# Patient Record
Sex: Male | Born: 1945 | Race: White | Hispanic: No | Marital: Married | Smoking: Never smoker
Health system: Southern US, Community
[De-identification: ages and names within clinical notes are randomized; demographics above are authoritative.]

## PROBLEM LIST (undated history)

## (undated) DIAGNOSIS — G4730 Sleep apnea, unspecified: Secondary | ICD-10-CM

## (undated) DIAGNOSIS — T8859XA Other complications of anesthesia, initial encounter: Secondary | ICD-10-CM

## (undated) DIAGNOSIS — K219 Gastro-esophageal reflux disease without esophagitis: Secondary | ICD-10-CM

## (undated) DIAGNOSIS — C801 Malignant (primary) neoplasm, unspecified: Secondary | ICD-10-CM

## (undated) DIAGNOSIS — M1990 Unspecified osteoarthritis, unspecified site: Secondary | ICD-10-CM

## (undated) DIAGNOSIS — F32A Depression, unspecified: Secondary | ICD-10-CM

## (undated) DIAGNOSIS — I10 Essential (primary) hypertension: Secondary | ICD-10-CM

## (undated) DIAGNOSIS — Z87442 Personal history of urinary calculi: Secondary | ICD-10-CM

## (undated) DIAGNOSIS — I639 Cerebral infarction, unspecified: Secondary | ICD-10-CM

---

## 1998-04-10 ENCOUNTER — Encounter (HOSPITAL_COMMUNITY): Admission: RE | Admit: 1998-04-10 | Discharge: 1998-07-09 | Payer: Self-pay | Admitting: Neurology

## 2006-07-28 ENCOUNTER — Encounter: Admission: RE | Admit: 2006-07-28 | Discharge: 2006-07-28 | Payer: Self-pay | Admitting: Neurological Surgery

## 2006-08-27 ENCOUNTER — Observation Stay (HOSPITAL_COMMUNITY): Admission: RE | Admit: 2006-08-27 | Discharge: 2006-08-28 | Payer: Self-pay | Admitting: Orthopedic Surgery

## 2006-09-24 ENCOUNTER — Encounter: Admission: RE | Admit: 2006-09-24 | Discharge: 2006-09-24 | Payer: Self-pay | Admitting: Neurological Surgery

## 2006-10-14 ENCOUNTER — Encounter: Payer: Self-pay | Admitting: Urology

## 2006-10-15 ENCOUNTER — Ambulatory Visit (HOSPITAL_COMMUNITY): Admission: RE | Admit: 2006-10-15 | Discharge: 2006-10-16 | Payer: Self-pay | Admitting: Urology

## 2007-01-11 ENCOUNTER — Encounter: Admission: RE | Admit: 2007-01-11 | Discharge: 2007-01-11 | Payer: Self-pay | Admitting: Neurological Surgery

## 2007-04-12 ENCOUNTER — Encounter: Admission: RE | Admit: 2007-04-12 | Discharge: 2007-04-12 | Payer: Self-pay | Admitting: Neurological Surgery

## 2007-06-04 ENCOUNTER — Ambulatory Visit (HOSPITAL_COMMUNITY): Admission: RE | Admit: 2007-06-04 | Discharge: 2007-06-04 | Payer: Self-pay | Admitting: Surgery

## 2007-07-19 ENCOUNTER — Encounter: Admission: RE | Admit: 2007-07-19 | Discharge: 2007-07-19 | Payer: Self-pay | Admitting: Neurological Surgery

## 2007-08-30 IMAGING — CR DG CERVICAL SPINE 1V
1 series · 1 of 1 positions shown · non-contrast
Comparison: Intraoperative spot film dated 08/27/06.

CLINICAL DATA: Post cervical fusion.  Posterior neck pain radiating to right shoulder and arm.  
CERVICAL SPINE ONE VIEW:

[view not recorded]
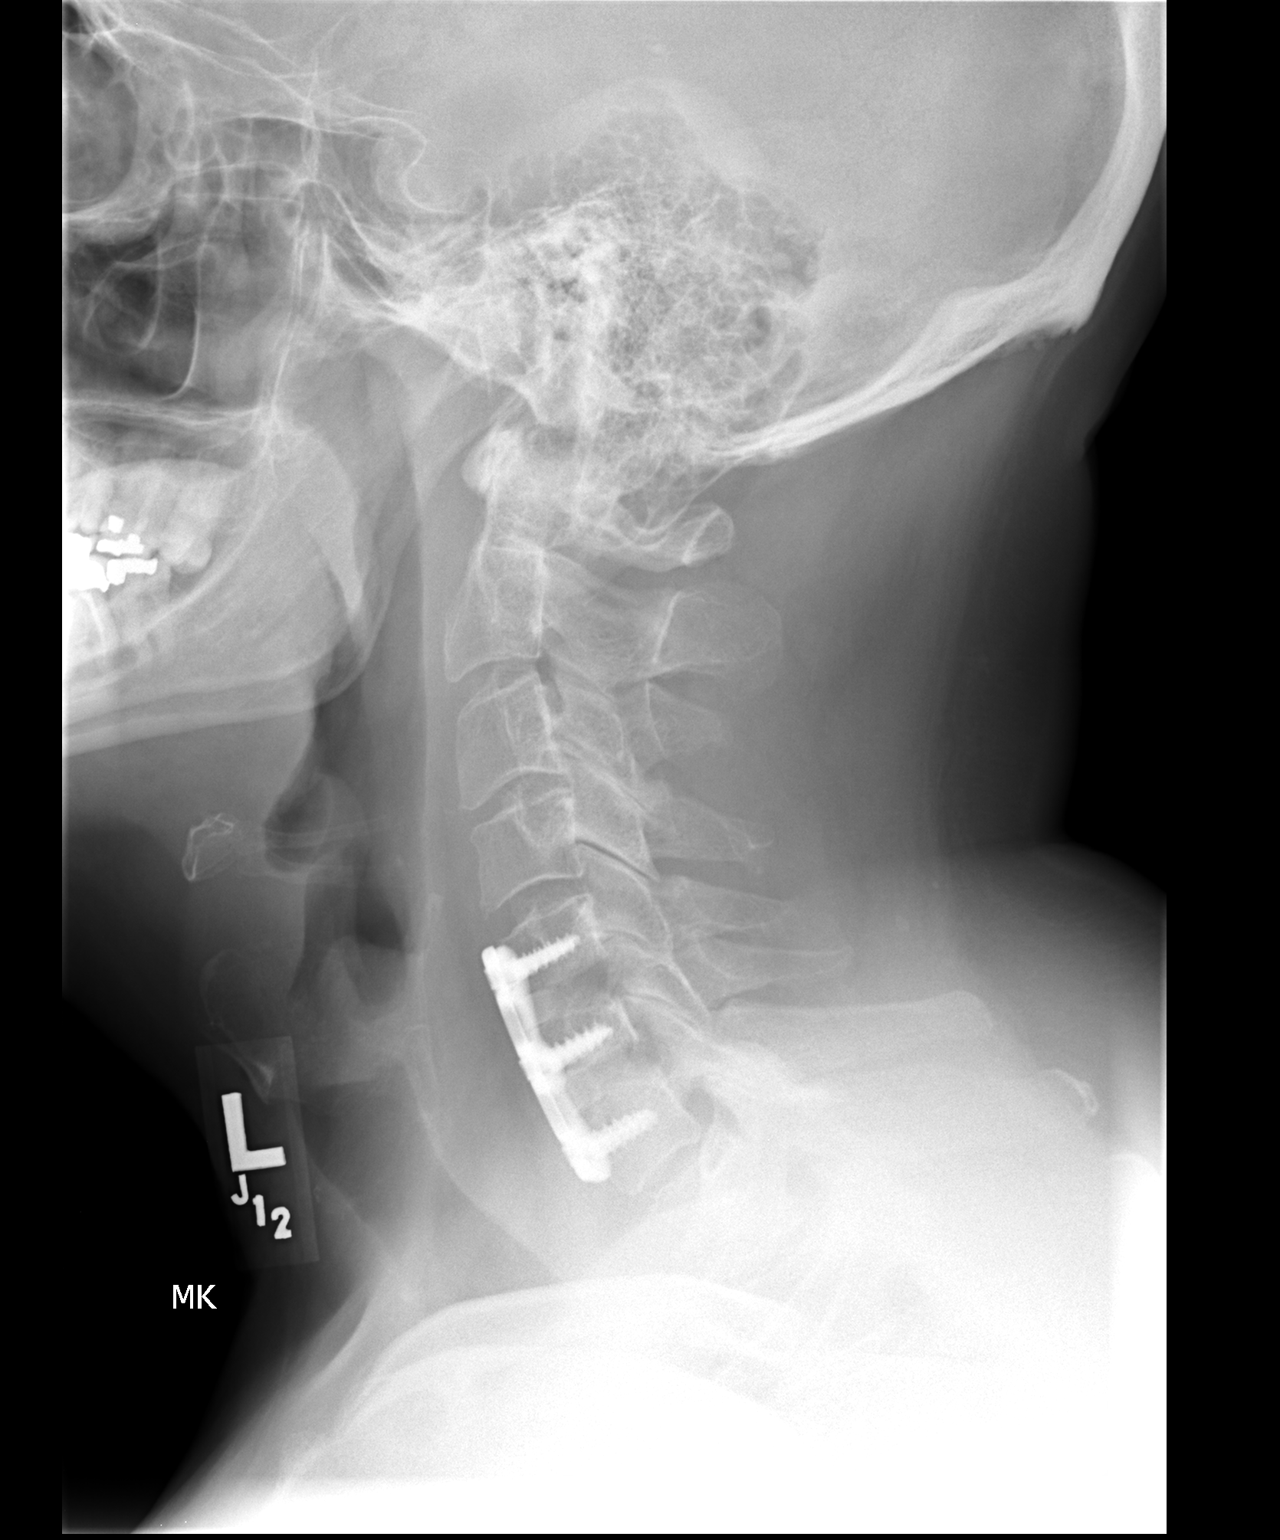

[1 of 1 positions shown; findings below may reference images not displayed]

FINDINGS: Status post C5 to C7 anterior discectomy with fusion and plating.  Good position and alignment of the bony elements and hardware.  There is perhaps mild fullness of the prevertebral soft tissues anterior to the plate.  This may be a normal finding three weeks post operatively, but a hematoma or abcess cannot be excluded.  needs clinical correlation.  The appearance has changed since the intraoperative film.
IMPRESSION: 1.  Satisfactory position and alignment post C5 to C7 ACDF with anterior plate. 
2.  There is fullness of the prevertebral soft tissues.  The significance is questionable in the post operative.  Careful clinical correlation needed.

## 2008-05-15 ENCOUNTER — Encounter: Admission: RE | Admit: 2008-05-15 | Discharge: 2008-05-15 | Payer: Self-pay | Admitting: Neurological Surgery

## 2008-05-19 ENCOUNTER — Encounter: Admission: RE | Admit: 2008-05-19 | Discharge: 2008-05-19 | Payer: Self-pay | Admitting: Neurological Surgery

## 2008-06-14 ENCOUNTER — Ambulatory Visit: Admission: RE | Admit: 2008-06-14 | Discharge: 2008-06-14 | Payer: Self-pay | Admitting: Neurological Surgery

## 2008-07-04 ENCOUNTER — Inpatient Hospital Stay (HOSPITAL_COMMUNITY): Admission: RE | Admit: 2008-07-04 | Discharge: 2008-07-06 | Payer: Self-pay | Admitting: Neurological Surgery

## 2008-08-07 ENCOUNTER — Encounter: Admission: RE | Admit: 2008-08-07 | Discharge: 2008-08-07 | Payer: Self-pay | Admitting: Neurological Surgery

## 2008-10-16 ENCOUNTER — Encounter: Admission: RE | Admit: 2008-10-16 | Discharge: 2008-10-16 | Payer: Self-pay | Admitting: Neurological Surgery

## 2009-01-17 ENCOUNTER — Encounter: Admission: RE | Admit: 2009-01-17 | Discharge: 2009-01-17 | Payer: Self-pay | Admitting: Neurological Surgery

## 2009-05-20 IMAGING — CR DG CHEST 2V
2 series · 2 of 2 positions shown · non-contrast
Comparison: 08/25/2006.

CLINICAL DATA: Preadmit.

CHEST - 2 VIEW

[w chest pa]
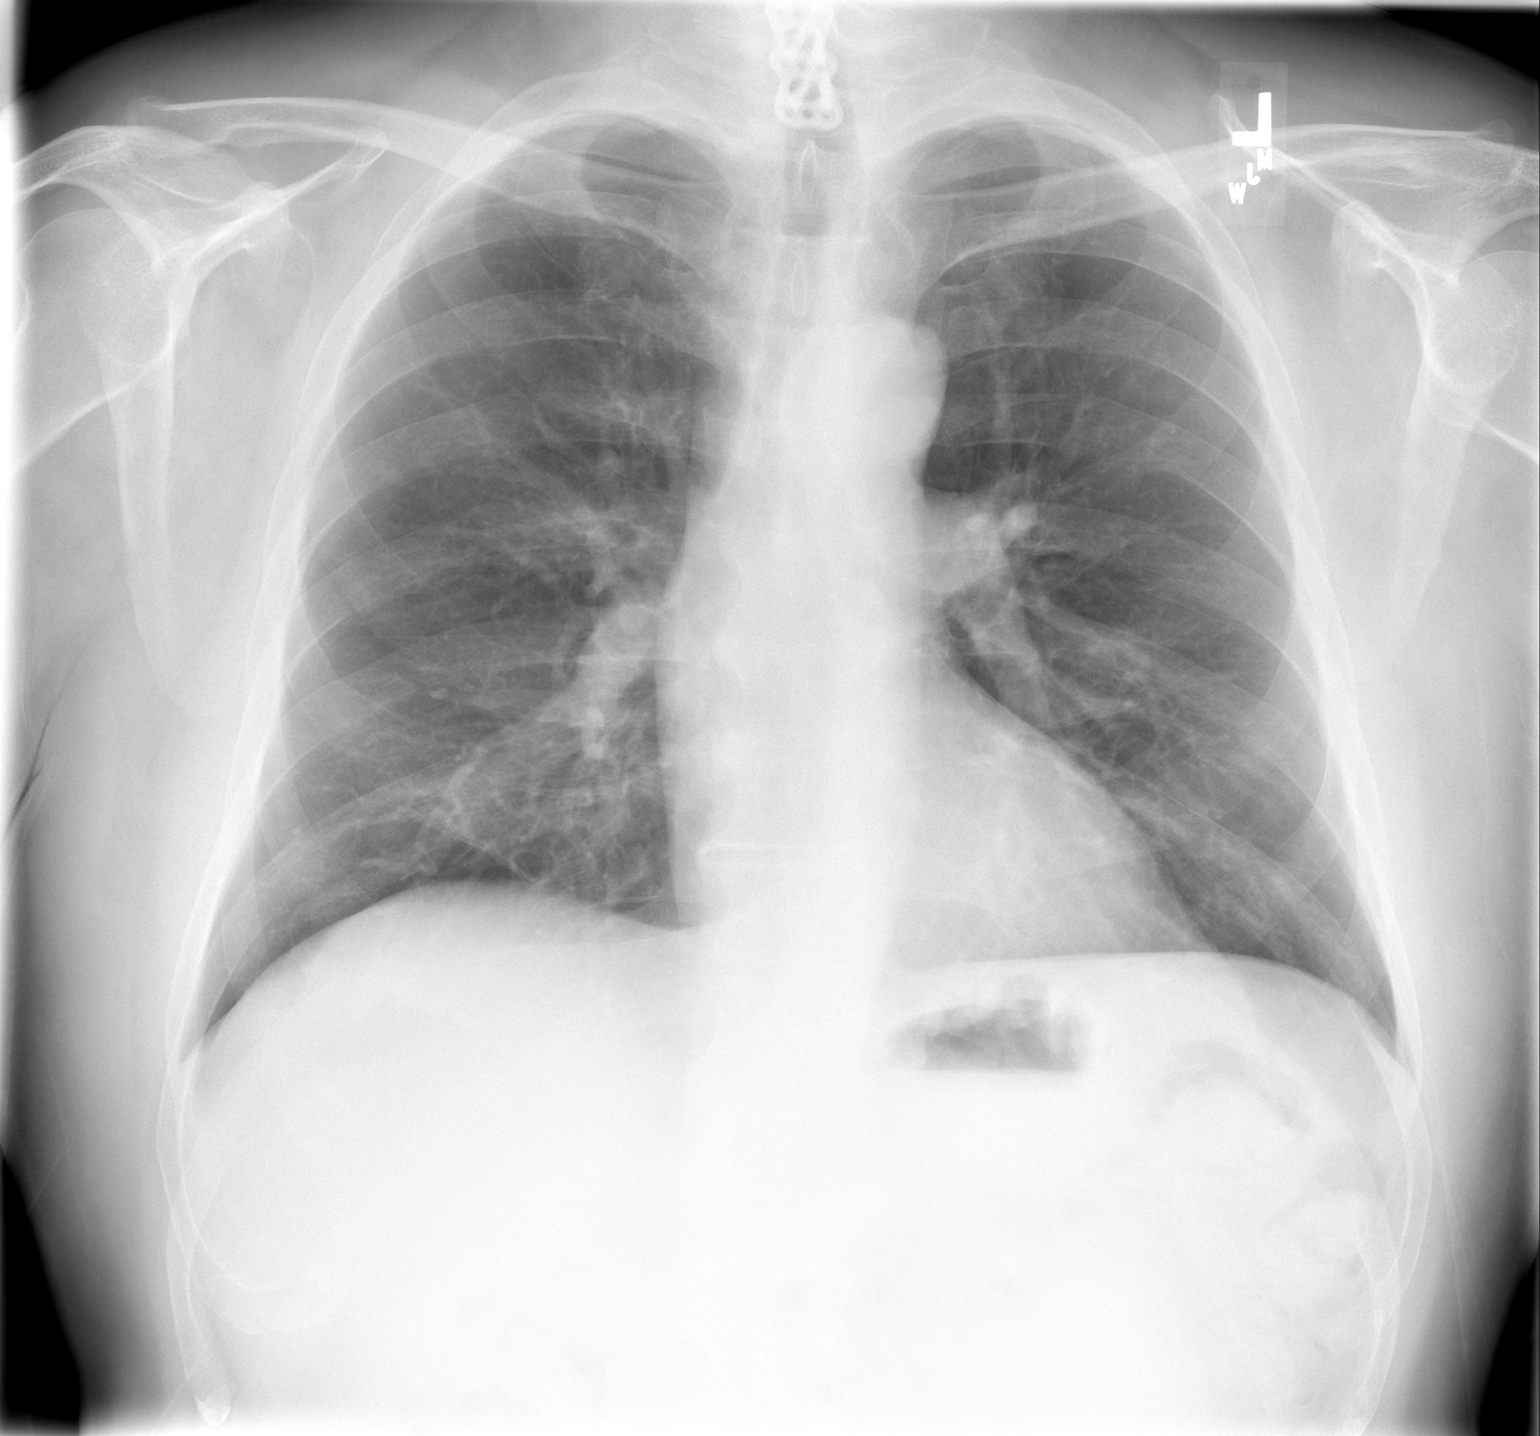

[w chest lat]
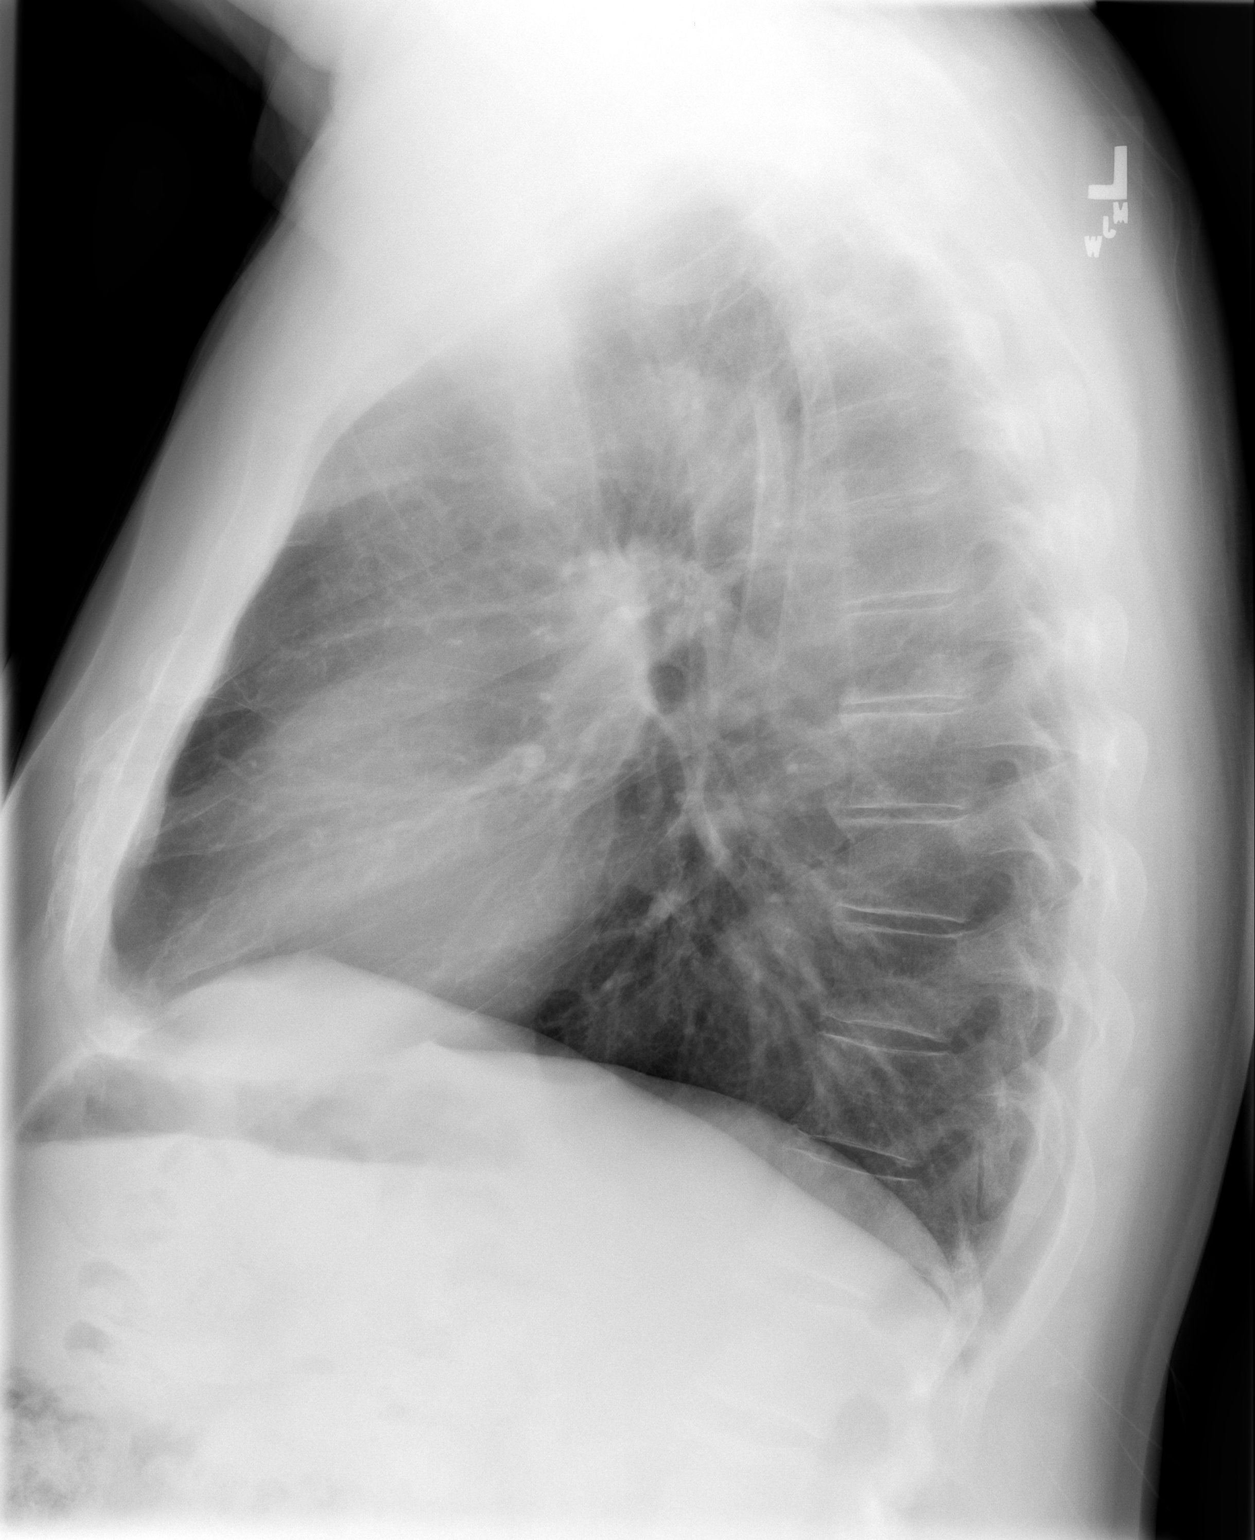

[2 of 2 positions shown; findings below may reference images not displayed]

FINDINGS: Heart size is normal and the vascularity is normal.  The
lungs are clear and  there is no infiltrate or effusion.  There is
no mass lesion.
IMPRESSION: No active cardiopulmonary disease and no interval change.

## 2010-08-08 LAB — DIFFERENTIAL
Eosinophils Relative: 6 % — ABNORMAL HIGH (ref 0–5)
Lymphocytes Relative: 32 % (ref 12–46)

## 2010-08-08 LAB — TYPE AND SCREEN

## 2010-08-08 LAB — BASIC METABOLIC PANEL
Calcium: 9.7 mg/dL (ref 8.4–10.5)
GFR calc non Af Amer: >60 mL/min (ref >60)
Glucose, Bld: 134 mg/dL — ABNORMAL HIGH (ref 70–99)
Potassium: 4.5 mEq/L (ref 3.5–5.1)
Sodium: 137 mEq/L (ref 135–145)

## 2010-08-08 LAB — CBC
HCT: 46.7 % (ref 39.0–52.0)
Hemoglobin: 16.3 g/dL (ref 13.0–17.0)
MCHC: 34.8 g/dL (ref 30.0–36.0)
MCV: 93.8 fL (ref 78.0–100.0)
Platelets: 226 10*3/uL (ref 150–400)
RDW: 13 % (ref 11.5–15.5)

## 2010-08-13 LAB — DIFFERENTIAL
Eosinophils Absolute: 0.3 10*3/uL (ref 0.0–0.7)
Eosinophils Relative: 5 % (ref 0–5)
Lymphs Abs: 1.7 10*3/uL (ref 0.7–4.0)
Monocytes Absolute: 0.5 10*3/uL (ref 0.1–1.0)
Monocytes Relative: 9 % (ref 3–12)

## 2010-08-13 LAB — TYPE AND SCREEN: Antibody Screen: NEGATIVE

## 2010-08-13 LAB — BASIC METABOLIC PANEL
CO2: 29 mEq/L (ref 19–32)
Chloride: 103 mEq/L (ref 96–112)
GFR calc Af Amer: >60 mL/min (ref >60)
Potassium: 4.9 mEq/L (ref 3.5–5.1)

## 2010-08-13 LAB — CBC
HCT: 45.6 % (ref 39.0–52.0)
MCV: 94 fL (ref 78.0–100.0)
RBC: 4.85 MIL/uL (ref 4.22–5.81)
WBC: 5.6 10*3/uL (ref 4.0–10.5)

## 2010-08-13 LAB — ABO/RH: ABO/RH(D): A POS

## 2010-09-10 NOTE — Op Note (Signed)
NAMEKALUM, MINNER              ACCOUNT NO.:  [id number]   MEDICAL RECORD NO.:  [id number]          PATIENT TYPE:  OIB   LOCATION:  1422                         FACILITY:  [facility]   PHYSICIAN:  Excell Seltzer. Annabell Howells, M.D.    DATE OF BIRTH:  May 25, 1945   DATE OF PROCEDURE:  DATE OF DISCHARGE:                               OPERATIVE REPORT   PREOPERATIVE DIAGNOSIS:  Benign prostatic hypertrophy and bladder outlet  obstruction.   POSTOPERATIVE DIAGNOSIS:  Benign prostatic hypertrophy and bladder  outlet obstruction.   SURGEON:  Dr. Bjorn Pippin.   ANESTHESIA:  General.   SPECIMEN:  Prostate chips.   BLOOD LOSS:  Approximately 200 mL.   DRAINS:  A 22 French 3-way Foley catheter.   COMPLICATIONS:  None.   INDICATIONS:  Mr. Menna is a 65 year old white male with a history of  BPH and bladder outlet obstruction who is to undergo TURP.   FINDINGS AND PROCEDURE:  He was given Cipro.  He was taken to the  operating room where a general anesthetic was induced.  PAS hose were  placed.  He was placed in the lithotomy position.  His perineum and  genitalia were prepped with Betadine solution, he was draped in the  usual sterile fashion.  A cystoscopy was performed using a 22 [place]  scope and 12 and 70 degree lens.  Examination revealed a normal urethra.  The external sphincter was intact.  The prostatic urethra had trilobar  hyperplasia with obstruction.  Examination of bladder revealed mild  trabeculation.  No tumors, stones or inflammation were noted.  Ureteral  orifices were well away from the bladder neck.   The urethra was then calibrated to 32 [place] with [employer] and a  28 French continuous flow resectoscope was used, it was inserted without  difficulty.  This was fitted with an Wandra Scot handle with a 12 degree  lens and prostate loop.   The mid lobe of the prostate was resected initially.  Resection was then  carried out along the floor of the prostate up to and alongside  of the  verumontanum.  The left lobe of the prostate was resected from bladder  neck to apex out to the capsular fibers.  The right lobe was resected  from bladder neck to apex out to the capsular fibers.  The bladder was  evacuated free of chips and residual apical and anterior tissue was then  resected and those chips were removed.  Inspection revealed an excellent  TUR channel, no retained chips in the bladder.  There was some minor  venous bleeding at the right bladder neck but no arterial bleeding.  Upon removal of the resectoscope pressure on the bladder produced an  excellent strain.  A #22 [place] 3-way Foley catheter was inserted,  balloon was filled with 30 mL of sterile fluid.  The catheter was held  on traction and hand irrigated until clear.  The traction was then  released  and irrigant remained clear.  The patient's catheter was placed to  straight drainage and secured to thigh on light traction with a Velcro  strap.  He was taken down from lithotomy position.  His anesthetic was  reversed.  He was removed to the recovery room in stable condition,  having no complications.      Excell Seltzer. Annabell Howells, M.D.  Electronically Signed     JJW/MEDQ  D:  10/15/2006  T:  10/15/2006  Job:  [id number]   cc:   Alexia Freestone, M.D.  Fax: [phone]

## 2010-09-10 NOTE — Discharge Summary (Signed)
[name], [name]:  [id number]   MEDICAL RECORD NO.:  [id number]          PATIENT TYPE:  INP   LOCATION:  3041                         FACILITY:  [facility]   PHYSICIAN:  Tia Alert, MD     DATE OF BIRTH:  [birth year]   DATE OF ADMISSION:  07/04/2008  DATE OF DISCHARGE:  07/06/2008                               DISCHARGE SUMMARY   PREOPERATIVE DIAGNOSES:  1. Lumbar spinal stenosis, L3-L4.  2. Spondylolisthesis, L5-S1 with segmental instability.   PROCEDURES:  1. Depressive lumbar laminectomy, L3-L4.  2. Posterior lumbar interbody fusion, L5-S1.   BRIEF HISTORY OF PRESENT ILLNESS:  [name] is a 65 year old gentleman  who has a long history of progressive low back pain with bilateral leg  pain.  He had known spinal stenosis at L3-L4 with a grade 1  anterolisthesis of L5-S1 with segmental instability at that level with  degenerative disease.  He tried medical management for quite some time  without significant relief.  I recommended decompressive laminectomy at  L3-L4 and instrumented fusion at L5-S1.  He understood the risks,  benefits, and expected outcome and wished to proceed.   HOSPITAL COURSE:  The patient was admitted on July 04, 2008 and was  taken to the operating room where he underwent a lumbar laminectomy at  L3-L4 and instrumented fusion at L5-S1.  The patient tolerated the  procedure and was taken to the recovery room and then to the floor in  stable condition.  For details of the operative procedure, please see  the dictated operative note.  The patient's hospital course was routine.  There were no complications.  He remained afebrile with stable vital  signs.  He had a Hemovac in place until discharge.  He had control of  his pain with a Dilaudid PCA for the first 2 days and then was switched  to oral pain medications which he tolerated well and this controlled his  pain.  He had minimal pain on the day of discharge.  He had no leg  pain  and no numbness, tingling, or weakness.  He is ambulating well, voiding  well, and eating well.  He was discharged home in stable condition on  July 06, 2008 with plans to follow up in 2 weeks.   FINAL DIAGNOSIS:  Posterior lumbar interbody fusion, L5-S1.      Tia Alert, MD  Electronically Signed     DSJ/MEDQ  D:  07/06/2008  T:  07/07/2008  Job:  [id number]

## 2010-09-10 NOTE — Op Note (Signed)
Ivan Washington, Ivan Washington NO.:  [id number]   MEDICAL RECORD NO.:  [id number]          PATIENT TYPE:  INP   LOCATION:  3041                         FACILITY:  [facility]   PHYSICIAN:  Tia Alert, MD     DATE OF BIRTH:  1945-10-14   DATE OF PROCEDURE:  07/04/2008  DATE OF DISCHARGE:                               OPERATIVE REPORT   PREOPERATIVE DIAGNOSES:  1. Lumbar spinal stenosis, L3-4.  2. Degenerative disk disease with grade 1 anterior listhesis, L5-S1.  3. Back pain.  4. Leg pain.   POSTOPERATIVE DIAGNOSES:  1. Lumbar spinal stenosis, L3-4.  2. Degenerative disk disease with grade 1 anterior listhesis, L5-S1.  3. Back pain.  4. Leg pain.   PROCEDURE:  1. Decompressive lumbar laminectomy, hemifacetectomy, and      foraminotomies at L5-S1 for decompression of L5 and S1 nerve roots      requiring more work than is required with the typical posterior      lumbar interbody fusion procedure.  2. Lumbar hemilaminectomy, medial facetectomy, and foraminotomy at L3-      4 on the left followed by sublaminar decompression L3-4 for central      canal and right lateral recess decompression for decompression of      the L4 nerve roots bilaterally.  Note, this is a separate level      from the interbody fusion level.  3. Posterior lumbar interbody fusion L5-S1 utilizing an 8 x 22-mm PEEK      interbody cage packed with local autograft and Actifuse putty and      an 8 x 22-mm tangent interbody bone wedge.  4. Intertransverse arthrodesis L5-S1 utilizing locally harvested      morselized autologous bone graft and Actifuse putty.  5. Nonsegmental fixation L5-S1 utilizing the [employer] pedicle screw      system.   SURGEON:  Tia Alert, MD   ASSISTANT:  Kathaleen Maser. Pool, MD   ANESTHESIA:  General endotracheal.   COMPLICATIONS:  None apparent.   INDICATIONS FOR PROCEDURE:  Mr. Struckman is a 65 year old gentleman who  has had a long history of progressive mechanical back pain  along with  bilateral leg pain down the fronts and backs of his left leg worse than  his right leg.  He had an MRI an year ago which showed degenerative disk  disease with a grade 1 anterior listhesis of L5 on S1 with mild spinal  stenosis at L3-4.  He then underwent CT myelography which showed  progression of the stenosis at L3-4 with the segmental instability at L5-  S1 and foraminal stenosis at that level.  Recommended decompressive  laminectomy at L3-4 to address the spinal stenosis followed by an  instrumented fusion at L5-S1.  This to address the foraminal stenosis  and the segmental instability at that level.  He understood the risks,  benefits, and expected outcome and wished to proceed.   DESCRIPTION OF PROCEDURE:  The patient was taken to operating room and  after induction of adequate generalized endotracheal anesthesia, he was  rolled in the prone position on chest  rolls and all pressure points were  padded.  His lumbar region was prepped with DuraPrep and then draped in  the usual sterile fashion.  A 10 mL of local anesthesia was injected,  and a dorsal midline incision was made and carried down to the  lumbosacral fascia.  The fascia was opened and the paraspinous  musculature was taken down in a subperiosteal fashion to expose L3-4, L4-  5, and L5-S1.  Intraoperative fluoroscopy confirmed my levels and then I  started at L3-4 and performed a hemilaminectomy, medial facetectomy, and  foraminotomy at L3-4 on the left utilizing the Kerrison punch.  The  underlying yellow ligament was opened and removed to expose the  underlying dura and L4 nerve root.  I dissected out to the medial  pedicle wall and into the foramen to decompress the L4 nerve root  distally and undercut lateral recess.  I then drilled up under the  spinous process and then used a Kerrison punch to bite across the  midline into the right lateral recess from the left-sided approach until  I had the right L4  nerve root decompressed as well as the lateral  recess.  Once this was done, I packed this with Gelfoam and then went to  L5-S1.  I removed the spinous process and then performed a complete  laminectomy, hemifacetectomy, and foraminotomy at L5-S1.  The yellow  ligament was removed.  The L5 and S1 nerve roots were identified and  decompressed distally into their respective foramina, then palpated  along those nerves to assure adequate decompression.  I then decided to  put the pedicle screws.  I first localized the pedicle entry zones  utilizing surface landmarks and lateral fluoroscopy.  I probed each  pedicle with a pedicle probe, then tapped each pedicle with a 5.5 tap,  and then placed 6.5 x 45-mm pedicle screws into the L5 pedicles  bilaterally, and then placed 6.5 x 40-mm pedicle screws into the S1  pedicles bilaterally.  I then decorticated the transverse processes and  placed a mixture of local autograft and Actifuse out over these to  perform intertransverse arthrodesis at L5-S1.  I then incised the disk  space bilaterally and used sequential distraction to distract the disk  space to 8 mm.  It was a very collapsed disk space.  We then performed a  thorough intradiskal diskectomy utilizing rotating cutters and cutting  chisels and midline was prepared with Epstein curettes.  A near total  diskectomy was performed.  The cutting chisel used to prepare the  endplates for arthrodesis.  We then placed an 8 x 22-mm tangent  interbody bone wedge into the interspace at L5-S1 on the left and an 8 x  22-mm PEEK interbody cage packed with local autograft and Actifuse putty  on the patient's right side.  The midline was packed local autograft and  Actifuse putty.  We then irrigated with saline solution containing  bacitracin, placed lordotic rods into the multiaxial screw heads of the  pedicle screws and locked these into position with locking caps and anti-  torque device while achieving  compression of our grafts.  I then placed  a medium Hemovac drain through a separate stab incision, lined the dura  with Gelfoam, and then closed the muscle and the fascia with 0 Vicryl,  closed the subcutaneous and subcuticular tissue with 2-0 and 3-0 Vicryl,  and closed the skin with Benzoin and Steri-Strips.  The drapes were  removed.  A sterile dressing  was applied.  The patient was awakened from  general anesthesia and transported to the recovery room in stable  condition.  At the end of the procedure, all sponge, needles, and  instrument counts were correct.      Tia Alert, MD  Electronically Signed     DSJ/MEDQ  D:  07/04/2008  T:  07/04/2008  Job:  [id number]

## 2010-09-10 NOTE — Op Note (Signed)
Ivan Washington, Ivan Washington              ACCOUNT NO.:  [id number]   MEDICAL RECORD NO.:  [id number]          PATIENT TYPE:  AMB   LOCATION:  DAY                          FACILITY:  [facility]   PHYSICIAN:  Thomas A. Cornett, M.D.DATE OF BIRTH:  [birth year]   DATE OF PROCEDURE:  06/04/2007  DATE OF DISCHARGE:                               OPERATIVE REPORT   PREOPERATIVE DIAGNOSIS:  Recurrent right inguinal hernia.   POSTOPERATIVE DIAGNOSIS:  Recurrent right inguinal hernia.   PROCEDURE:  Laparoscopic extraperitoneal right inguinal hernia repair  with Ultrapro mesh.   SURGEON:  Maisie Fus A. Cornett, M.D.   ANESTHESIA:  General endotracheal anesthesia 0.25% Sensorcaine.   ESTIMATED BLOOD LOSS:  20 mL.   SPECIMENS:  None.   INDICATIONS FOR PROCEDURE:  The patient is a 65 year old male who had a  right inguinal hernia repair about 30 years ago.  He developed  recurrence in his right groin.  We discussed options which include open  versus laparoscopic and he wished to proceed with laparoscopic approach.  Risks of the procedure were discussed preoperatively with the patient.  He voiced his understanding and agreed to proceed.   DESCRIPTION OF PROCEDURE:  The patient was brought to the operating  room, placed supine.  After induction of general anesthesia, a Foley  catheter was placed.  After sterile prep and drape of the abdomen, a 1  cm infraumbilical incision was made.  Dissection was carried down to his  fascia and his fascia was opened with a scalpel.  Muscle fibers of the  right rectus were retracted laterally.  The posterior sheath was  identified.  I used my finger to bluntly create a space below the  muscle.  We then placed a Spacemaker balloon port combination advanced  down until I hit the pubis.  We then insufflated the balloon in the  preperitoneal space under direct visualization with the scope.  We kept  it inflated for about a minute to expand completely.  Once this was  done, the  balloon was removed the preperitoneal space was insufflated.  Other significant preperitoneal fat.  We focused our attention to the  right inguinal canal.  In the midline, two 5 mm ports were placed with  local anesthesia under direct vision.  We then used graspers, we began  to identify what appeared to be the hernia.  There is a direct hernia  and I reduced some preperitoneal fat out of this.  The cord structures  were laterally and were scarred down from his previous repair it looked  like.  We were able to identify the pubis as well.  I then created a  small space anterior to the bladder below the pubis.  Once all these  landmarks were identified, I examined the left inguinal canal.  I did  not see any evidence of hernia laparoscopically on this side.  I then  used a large piece of Ultrapro mesh measuring 3 inches x 6 inches.  We  placed this in the preperitoneal space.  I tucked this down behind the  pubic symphysis.  Then I pulled it up until  I was well above the defect  on the anterior abdominal wall.  This was then laid out laterally.  I  then used Tisseal and applied the mesh until I had it where I wanted it.  The Tisseal set up for about 10 minutes.  The mesh appeared adequately  fixed to below the iliopubic tract on top of the iliac vessels behind  the pubis and then superiorly, it was well above the defect and the  undersurface of the rectus abdominis muscle.  Laterally, we were well  above the anterior superior iliac spine.  I then fixed for hemostasis  and found it to be excellent.  We then slowly de-insufflated the  prepared space under direct vision and the mesh seemed to stay in place  quite nicely.  I then removed all trocars.  I closed the anterior sheath  of the rectus abdominis muscle with a figure-of-eight of 0 Vicryl.  Skin  incisions were closed 4-0 Monocryl.  Dermabond was applied.  Foley  catheter was then removed.  All final counts of sponges, needles and   instruments were found to be correct at this portion of the case.  The  patient was then extubated and taken to recovery in satisfactory  condition.      Thomas A. Cornett, M.D.  Electronically Signed     TAC/MEDQ  D:  06/04/2007  T:  06/06/2007  Job:  [id number]   cc:   Excell Seltzer. Annabell Howells, M.D.  Fax: [phone]

## 2010-09-13 NOTE — Op Note (Signed)
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   PROCEDURES:  1. Decompressive anterior cervical diskectomy C5-6, C6-7.  2. Anterior cervical arthrodesis C5-6, C6-7 utilizing a 6-mm MTF      corticocancellous allograft at C5-6 and a 7 mm graft at C6-7.  3. Anterior cervical plating C5-C7 utilizing a 42 mm Ventura plate.   SURGEON:  Dr. Marikay Alar.   ASSISTANT:  Dr. Aliene Beams.   ANESTHESIA:  General endotracheal.   COMPLICATIONS:  None apparent.   INDICATIONS FOR PROCEDURE:  Ivan Washington is a 65 year old gentleman who  was referred with neck and right arm pain.  He had an MRI and then a CT  myelogram which showed spondylosis at C5-6 and C6-7 with significant  degenerative disk disease and collapse of the disk space with  biforaminal narrowing and cut off of the right C6 nerve root sleeve.  He  had tried medical management for quite some time without significant  relief.  I recommended a decompressive anterior cervical diskectomy with  fusion plating at C5-6, C6-7.  He understood the risks, benefits, and  expected outcome and wished to proceed.   DESCRIPTION OF PROCEDURE:  The patient was taken to the operating room  after induction of adequate generalized endotracheal anesthesia he was  placed in the supine position on the operating room table.  His right  anterior cervical region was prepped with DuraPrep and then draped in a  usual sterile fashion.  Five mL of local anesthesia was injected and a  transverse incision was made to the right of the midline and carried  down to the platysma which was elevated, opened and undermined with  Metzenbaum scissors.  I then dissected a plane medial to the  sternocleidomastoid muscle and internal carotid artery and lateral to  the trachea and esophagus to expose C5-6 and C6-7.  Intraoperative  fluoroscopy confirmed my level and then the longus colli muscles were  taken down and the Shadow line retractors were placed under this to  expose C5-6 and C6-7.  The annulus was incised and then a neutral  diskectomy was done with pituitary rongeurs and Karlin curettes and then  the high-speed drill was used to drill the endplates to prepare for  later arthrodesis.  We drilled the C5-6 disk to a height of 6 mm and the  C6-7 disk to a height of 7 mm.  We drilled down to the level of the  __________ ligament drilling away posterior osteophytes as we went.  We  then brought in the operating microscope.  We started at C5-6.  We  opened the posterior longitudinal ligament with a nerve hook and then  removed this by undercutting the bodies of C5-C6 with the 2-mm Kerrison  punch.  Bilateral foraminotomies were performed.  He had an interesting  takeoff at the C6 nerve root.  It was actually inferior to the disk  space and the pedicle and then the nerve root swept superiorly and  around the pedicle and then down again.  These nerve roots were  visualized, followed out into the foramen and decompressed while  marching along the pedicle.  Bilateral foraminotomies were performed and  then we used a black nerve hook to palpate into the midline and into the  foramina to assure adequate decompression of both levels.  We then lined  this level with Gelfoam and performed the exact same decompression at C6-  7 by opening the posterior longitudinal ligament and then undercutting  the bodies of C6-C7 to decompress the central canal and then performed  bilateral  foraminotomies by identifying the pedicle, identifying the  nerve root and then marching out into the foramen along the nerve root  and decompress the nerve roots.  We then palpated with a black nerve  hook once again and into the midline and into the foramen to assure  adequate decompression.  Again the dura was nice and capacious and full  at both levels.  We could see the cord pulsatile through the dura.   We then irrigated with saline solution, dried all bleeding points.  We  lined the dura with Gelfoam and placed a 6 to dry the surgical bed.  We  irrigated once again, inspected our endplates to assure that we had nice  surfaces for fusion.  We then used a 6-mm MTF corticocancellous  allograft at C5-6 and a 7 mm graft and C6-7 and tapped these into  position.  We then used a 42-mm Venture plate and placed  two 13-mm very  variable angle screws in the bodies of C5, C6 and C7 and these locked  into the plate by the locking mechanism within the plate.  We then  irrigated with saline solution containing bacitracin, dried all bleeding  points with bipolar cautery.  Once meticulous hemostasis was achieved  closed the platysma with 3-0 Vicryl, closed the subcuticular tissue with  3-0 Vicryl and closed the skin with Benzoin and Steri-Strips.  The  drapes were removed.  A sterile dressing was applied.  The patient was  awakened from general anesthesia and transferred to the recovery room in  stable condition.  At the end of the procedure all sponge, needle and  instrument counts were correct.      Tia Alert, MD  Electronically Signed     DSJ/MEDQ  D:  08/27/2006  T:  08/27/2006  Job:  [id number]

## 2011-01-17 LAB — DIFFERENTIAL
Basophils Absolute: 0
Lymphocytes Relative: 28
Monocytes Absolute: 0.5
Neutro Abs: 3.6

## 2011-01-17 LAB — BASIC METABOLIC PANEL
Calcium: 9.8
GFR calc Af Amer: >60
GFR calc non Af Amer: >60
Glucose, Bld: 101 — ABNORMAL HIGH
Sodium: 138

## 2011-01-17 LAB — CBC
Hemoglobin: 16.1
RBC: 5.1
RDW: 13.7

## 2011-02-12 LAB — CBC
HCT: 44
Hemoglobin: 15.1
MCV: 91.2
RBC: 4.82
WBC: 6.9

## 2011-02-12 LAB — BASIC METABOLIC PANEL
BUN: 20
Chloride: 103
GFR calc Af Amer: >60
Potassium: 4.8

## 2012-02-19 ENCOUNTER — Other Ambulatory Visit: Payer: Self-pay | Admitting: Neurological Surgery

## 2012-02-19 DIAGNOSIS — M545 Low back pain: Secondary | ICD-10-CM

## 2012-02-24 ENCOUNTER — Ambulatory Visit
Admission: RE | Admit: 2012-02-24 | Discharge: 2012-02-24 | Disposition: A | Discharge status: Home / Self Care | Payer: Medicare Other | Source: Ambulatory Visit | Attending: Neurological Surgery | Admitting: Neurological Surgery

## 2012-02-24 DIAGNOSIS — M545 Low back pain: Secondary | ICD-10-CM

## 2012-02-24 MED ORDER — GADOBENATE DIMEGLUMINE 529 MG/ML IV SOLN
19.0000 mL | Freq: Once | INTRAVENOUS | Status: AC | PRN
  Administered 2012-02-24: 19 mL via INTRAVENOUS

## 2020-04-09 ENCOUNTER — Other Ambulatory Visit: Payer: Self-pay | Admitting: Neurological Surgery

## 2020-04-09 DIAGNOSIS — M532X9 Spinal instabilities, site unspecified: Secondary | ICD-10-CM

## 2020-04-09 DIAGNOSIS — M48061 Spinal stenosis, lumbar region without neurogenic claudication: Secondary | ICD-10-CM

## 2020-04-23 ENCOUNTER — Encounter (HOSPITAL_COMMUNITY): Payer: Self-pay | Admitting: Neurological Surgery

## 2020-04-23 ENCOUNTER — Other Ambulatory Visit (HOSPITAL_COMMUNITY)
Admission: RE | Admit: 2020-04-23 | Discharge: 2020-04-23 | Disposition: A | Discharge status: Home / Self Care | Payer: Medicare Other | Source: Ambulatory Visit | Attending: Neurological Surgery | Admitting: Neurological Surgery

## 2020-04-23 DIAGNOSIS — Z20822 Contact with and (suspected) exposure to covid-19: Secondary | ICD-10-CM | POA: Insufficient documentation

## 2020-04-23 DIAGNOSIS — Z01812 Encounter for preprocedural laboratory examination: Secondary | ICD-10-CM | POA: Insufficient documentation

## 2020-04-23 LAB — SARS CORONAVIRUS 2 (TAT 6-24 HRS): SARS Coronavirus 2: NEGATIVE

## 2020-04-23 NOTE — Progress Notes (Signed)
Patient denies chest pain or shortness of breath.  Completed health history.  Last dose of Xarelto was 04/19/20.  Covid test results pending from today, 04/23/20.Labs ordered for DOS.

## 2020-04-25 ENCOUNTER — Inpatient Hospital Stay (HOSPITAL_COMMUNITY): Payer: Medicare Other | Admitting: Certified Registered"

## 2020-04-25 ENCOUNTER — Encounter (HOSPITAL_COMMUNITY): Payer: Self-pay | Admitting: Neurological Surgery

## 2020-04-25 ENCOUNTER — Inpatient Hospital Stay (HOSPITAL_COMMUNITY): Payer: Medicare Other

## 2020-04-25 ENCOUNTER — Other Ambulatory Visit: Payer: Self-pay

## 2020-04-25 ENCOUNTER — Inpatient Hospital Stay (HOSPITAL_COMMUNITY)
Admission: RE | Admit: 2020-04-25 | Discharge: 2020-04-26 | DRG: 455 | Disposition: A | Discharge status: Home Health | Payer: Medicare Other | Attending: Neurological Surgery | Admitting: Neurological Surgery

## 2020-04-25 ENCOUNTER — Encounter (HOSPITAL_COMMUNITY)
Admission: RE | Disposition: A | Discharge status: Home Health | Payer: Self-pay | Source: Home / Self Care | Attending: Neurological Surgery

## 2020-04-25 DIAGNOSIS — Z79899 Other long term (current) drug therapy: Secondary | ICD-10-CM | POA: Diagnosis not present

## 2020-04-25 DIAGNOSIS — M48061 Spinal stenosis, lumbar region without neurogenic claudication: Secondary | ICD-10-CM | POA: Diagnosis present

## 2020-04-25 DIAGNOSIS — G4730 Sleep apnea, unspecified: Secondary | ICD-10-CM | POA: Diagnosis not present

## 2020-04-25 DIAGNOSIS — Z981 Arthrodesis status: Secondary | ICD-10-CM

## 2020-04-25 DIAGNOSIS — Z419 Encounter for procedure for purposes other than remedying health state, unspecified: Secondary | ICD-10-CM

## 2020-04-25 DIAGNOSIS — Z20822 Contact with and (suspected) exposure to covid-19: Secondary | ICD-10-CM | POA: Diagnosis not present

## 2020-04-25 DIAGNOSIS — K219 Gastro-esophageal reflux disease without esophagitis: Secondary | ICD-10-CM | POA: Diagnosis present

## 2020-04-25 DIAGNOSIS — I10 Essential (primary) hypertension: Secondary | ICD-10-CM | POA: Diagnosis not present

## 2020-04-25 DIAGNOSIS — Z8673 Personal history of transient ischemic attack (TIA), and cerebral infarction without residual deficits: Secondary | ICD-10-CM

## 2020-04-25 DIAGNOSIS — M4316 Spondylolisthesis, lumbar region: Secondary | ICD-10-CM | POA: Diagnosis present

## 2020-04-25 DIAGNOSIS — F32A Depression, unspecified: Secondary | ICD-10-CM | POA: Diagnosis not present

## 2020-04-25 DIAGNOSIS — M532X9 Spinal instabilities, site unspecified: Secondary | ICD-10-CM

## 2020-04-25 HISTORY — DX: Depression, unspecified: F32.A

## 2020-04-25 HISTORY — DX: Other complications of anesthesia, initial encounter: T88.59XA

## 2020-04-25 HISTORY — DX: Cerebral infarction, unspecified: I63.9

## 2020-04-25 HISTORY — DX: Malignant (primary) neoplasm, unspecified: C80.1

## 2020-04-25 HISTORY — DX: Gastro-esophageal reflux disease without esophagitis: K21.9

## 2020-04-25 HISTORY — DX: Unspecified osteoarthritis, unspecified site: M19.90

## 2020-04-25 HISTORY — DX: Personal history of urinary calculi: Z87.442

## 2020-04-25 HISTORY — DX: Essential (primary) hypertension: I10

## 2020-04-25 HISTORY — DX: Sleep apnea, unspecified: G47.30

## 2020-04-25 LAB — CBC WITH DIFFERENTIAL/PLATELET
Abs Immature Granulocytes: 0.02 10*3/uL (ref 0.00–0.07)
Basophils Absolute: 0 10*3/uL (ref 0.0–0.1)
Basophils Relative: 1 %
Eosinophils Absolute: 0.1 10*3/uL (ref 0.0–0.5)
Eosinophils Relative: 3 %
HCT: 40.5 % (ref 39.0–52.0)
Hemoglobin: 13.6 g/dL (ref 13.0–17.0)
Immature Granulocytes: 1 %
Lymphocytes Relative: 30 %
Lymphs Abs: 1.3 10*3/uL (ref 0.7–4.0)
MCH: 31.6 pg (ref 26.0–34.0)
MCHC: 33.6 g/dL (ref 30.0–36.0)
MCV: 94 fL (ref 80.0–100.0)
Monocytes Absolute: 0.4 10*3/uL (ref 0.1–1.0)
Monocytes Relative: 8 %
Neutro Abs: 2.6 10*3/uL (ref 1.7–7.7)
Neutrophils Relative %: 57 %
Platelets: 170 10*3/uL (ref 150–400)
RBC: 4.31 MIL/uL (ref 4.22–5.81)
RDW: 14.9 % (ref 11.5–15.5)
WBC: 4.4 10*3/uL (ref 4.0–10.5)
nRBC: 0 % (ref 0.0–0.2)

## 2020-04-25 LAB — SURGICAL PCR SCREEN
MRSA, PCR: NEGATIVE
Staphylococcus aureus: POSITIVE — AB

## 2020-04-25 LAB — BASIC METABOLIC PANEL
Anion gap: 8 (ref 5–15)
BUN: 19 mg/dL (ref 8–23)
CO2: 27 mmol/L (ref 22–32)
Calcium: 9.3 mg/dL (ref 8.9–10.3)
Chloride: 102 mmol/L (ref 98–111)
Creatinine, Ser: 0.83 mg/dL (ref 0.61–1.24)
GFR, Estimated: >60 mL/min (ref >60)
Glucose, Bld: 104 mg/dL — ABNORMAL HIGH (ref 70–99)
Potassium: 3.9 mmol/L (ref 3.5–5.1)
Sodium: 137 mmol/L (ref 135–145)

## 2020-04-25 LAB — TYPE AND SCREEN
ABO/RH(D): A POS
Antibody Screen: NEGATIVE

## 2020-04-25 LAB — PROTIME-INR
INR: 1.1 (ref 0.8–1.2)
Prothrombin Time: 13.4 seconds (ref 11.4–15.2)

## 2020-04-25 SURGERY — POSTERIOR LUMBAR FUSION 1 WITH HARDWARE REMOVAL
Anesthesia: General | Site: Spine Lumbar

## 2020-04-25 MED ORDER — SUGAMMADEX SODIUM 200 MG/2ML IV SOLN
INTRAVENOUS | Status: DC | PRN
  Administered 2020-04-25: 200 mg via INTRAVENOUS

## 2020-04-25 MED ORDER — ASTAXANTHIN 4 MG PO CAPS: 8.0000 mg | ORAL_CAPSULE | Freq: Two times a day (BID) | ORAL | Status: DC

## 2020-04-25 MED ORDER — CEFAZOLIN SODIUM-DEXTROSE 2-4 GM/100ML-% IV SOLN
2.0000 g | INTRAVENOUS | Status: AC
  Administered 2020-04-25: 13:00:00 2 g via INTRAVENOUS
  Filled 2020-04-25: qty 100

## 2020-04-25 MED ORDER — CHLORHEXIDINE GLUCONATE 0.12 % MT SOLN: 15.0000 mL | Freq: Once | OROMUCOSAL | Status: AC

## 2020-04-25 MED ORDER — PHENYLEPHRINE 40 MCG/ML (10ML) SYRINGE FOR IV PUSH (FOR BLOOD PRESSURE SUPPORT)
PREFILLED_SYRINGE | INTRAVENOUS | Status: DC | PRN
  Administered 2020-04-25: 120 ug via INTRAVENOUS

## 2020-04-25 MED ORDER — PHENYLEPHRINE HCL-NACL 10-0.9 MG/250ML-% IV SOLN
INTRAVENOUS | Status: DC | PRN
  Administered 2020-04-25: 25 ug/min via INTRAVENOUS

## 2020-04-25 MED ORDER — EPHEDRINE SULFATE-NACL 50-0.9 MG/10ML-% IV SOSY
PREFILLED_SYRINGE | INTRAVENOUS | Status: DC | PRN
  Administered 2020-04-25: 10 mg via INTRAVENOUS

## 2020-04-25 MED ORDER — ACETAMINOPHEN 500 MG PO TABS
1000.0000 mg | ORAL_TABLET | ORAL | Status: AC
  Administered 2020-04-25: 12:00:00 1000 mg via ORAL
  Filled 2020-04-25: qty 2

## 2020-04-25 MED ORDER — DEXAMETHASONE SODIUM PHOSPHATE 10 MG/ML IJ SOLN
10.0000 mg | Freq: Once | INTRAMUSCULAR | Status: AC
  Administered 2020-04-25: 13:00:00 10 mg via INTRAVENOUS
  Filled 2020-04-25: qty 1

## 2020-04-25 MED ORDER — CEFAZOLIN SODIUM-DEXTROSE 2-4 GM/100ML-% IV SOLN
2.0000 g | Freq: Three times a day (TID) | INTRAVENOUS | Status: AC
  Administered 2020-04-25 – 2020-04-26 (×2): 2 g via INTRAVENOUS
  Filled 2020-04-25 (×2): qty 100

## 2020-04-25 MED ORDER — FENTANYL CITRATE (PF) 250 MCG/5ML IJ SOLN
INTRAMUSCULAR | Status: AC
  Filled 2020-04-25: qty 5

## 2020-04-25 MED ORDER — MENTHOL 3 MG MT LOZG: 1.0000 | LOZENGE | OROMUCOSAL | Status: DC | PRN

## 2020-04-25 MED ORDER — VORTIOXETINE HBR 20 MG PO TABS
20.0000 mg | ORAL_TABLET | Freq: Every day | ORAL | Status: DC
  Administered 2020-04-25: 21:00:00 20 mg via ORAL
  Filled 2020-04-25 (×2): qty 1

## 2020-04-25 MED ORDER — DIAZEPAM 5 MG/ML IJ SOLN
INTRAMUSCULAR | Status: AC
  Administered 2020-04-25: 16:00:00 2.5 mg via INTRAVENOUS
  Filled 2020-04-25: qty 2

## 2020-04-25 MED ORDER — SODIUM CHLORIDE 0.9% FLUSH
3.0000 mL | Freq: Two times a day (BID) | INTRAVENOUS | Status: DC
  Administered 2020-04-25: 23:00:00 3 mL via INTRAVENOUS

## 2020-04-25 MED ORDER — OXYCODONE HCL 5 MG PO TABS
5.0000 mg | ORAL_TABLET | ORAL | Status: DC | PRN
  Administered 2020-04-25: 5 mg via ORAL
  Administered 2020-04-26 (×3): 10 mg via ORAL
  Filled 2020-04-25 (×4): qty 2

## 2020-04-25 MED ORDER — PHENOL 1.4 % MT LIQD: 1.0000 | OROMUCOSAL | Status: DC | PRN

## 2020-04-25 MED ORDER — SODIUM CHLORIDE 0.9% FLUSH: 3.0000 mL | INTRAVENOUS | Status: DC | PRN

## 2020-04-25 MED ORDER — PROMETHAZINE HCL 25 MG/ML IJ SOLN: 6.2500 mg | INTRAMUSCULAR | Status: DC | PRN

## 2020-04-25 MED ORDER — PROPOFOL 10 MG/ML IV BOLUS
INTRAVENOUS | Status: AC
  Filled 2020-04-25: qty 20

## 2020-04-25 MED ORDER — SENNA 8.6 MG PO TABS
1.0000 | ORAL_TABLET | Freq: Two times a day (BID) | ORAL | Status: DC
  Administered 2020-04-25 – 2020-04-26 (×2): 8.6 mg via ORAL
  Filled 2020-04-25 (×2): qty 1

## 2020-04-25 MED ORDER — METHOCARBAMOL 1000 MG/10ML IJ SOLN
500.0000 mg | Freq: Four times a day (QID) | INTRAVENOUS | Status: DC | PRN
  Filled 2020-04-25: qty 5

## 2020-04-25 MED ORDER — ROCURONIUM BROMIDE 10 MG/ML (PF) SYRINGE
PREFILLED_SYRINGE | INTRAVENOUS | Status: AC
  Filled 2020-04-25: qty 10

## 2020-04-25 MED ORDER — OXYCODONE HCL 5 MG/5ML PO SOLN
5.0000 mg | Freq: Once | ORAL | Status: AC | PRN
Start: 2020-04-25 — End: 2020-04-25

## 2020-04-25 MED ORDER — PANTOPRAZOLE SODIUM 20 MG PO TBEC: 20.0000 mg | DELAYED_RELEASE_TABLET | Freq: Every day | ORAL | Status: DC

## 2020-04-25 MED ORDER — ONDANSETRON HCL 4 MG PO TABS: 4.0000 mg | ORAL_TABLET | Freq: Four times a day (QID) | ORAL | Status: DC | PRN

## 2020-04-25 MED ORDER — THROMBIN 20000 UNITS EX SOLR
CUTANEOUS | Status: AC
  Filled 2020-04-25: qty 20000

## 2020-04-25 MED ORDER — OXYCODONE HCL 5 MG PO TABS
5.0000 mg | ORAL_TABLET | Freq: Once | ORAL | Status: AC | PRN
  Administered 2020-04-25: 5 mg via ORAL

## 2020-04-25 MED ORDER — METHOCARBAMOL 500 MG PO TABS
500.0000 mg | ORAL_TABLET | Freq: Four times a day (QID) | ORAL | Status: DC | PRN
  Administered 2020-04-25 – 2020-04-26 (×2): 500 mg via ORAL
  Filled 2020-04-25 (×2): qty 1

## 2020-04-25 MED ORDER — HYDROMORPHONE HCL 1 MG/ML IJ SOLN
INTRAMUSCULAR | Status: AC
  Administered 2020-04-25: 17:00:00 0.5 mg via INTRAVENOUS
  Filled 2020-04-25: qty 1

## 2020-04-25 MED ORDER — BUPIVACAINE HCL (PF) 0.25 % IJ SOLN
INTRAMUSCULAR | Status: DC | PRN
  Administered 2020-04-25: 5 mL

## 2020-04-25 MED ORDER — ALBUMIN HUMAN 5 % IV SOLN: INTRAVENOUS | Status: DC | PRN

## 2020-04-25 MED ORDER — BUPIVACAINE HCL (PF) 0.25 % IJ SOLN
INTRAMUSCULAR | Status: AC
  Filled 2020-04-25: qty 30

## 2020-04-25 MED ORDER — AMLODIPINE BESYLATE 10 MG PO TABS
10.0000 mg | ORAL_TABLET | Freq: Every day | ORAL | Status: DC
  Administered 2020-04-26: 10:00:00 10 mg via ORAL
  Filled 2020-04-25: qty 1
  Filled 2020-04-25: qty 2

## 2020-04-25 MED ORDER — EPHEDRINE 5 MG/ML INJ
INTRAVENOUS | Status: AC
  Filled 2020-04-25: qty 10

## 2020-04-25 MED ORDER — LIDOCAINE 2% (20 MG/ML) 5 ML SYRINGE
INTRAMUSCULAR | Status: DC | PRN
  Administered 2020-04-25: 80 mg via INTRAVENOUS

## 2020-04-25 MED ORDER — FENTANYL CITRATE (PF) 100 MCG/2ML IJ SOLN
INTRAMUSCULAR | Status: DC | PRN
  Administered 2020-04-25 (×2): 50 ug via INTRAVENOUS
  Administered 2020-04-25: 100 ug via INTRAVENOUS

## 2020-04-25 MED ORDER — DEXAMETHASONE 4 MG PO TABS
4.0000 mg | ORAL_TABLET | Freq: Four times a day (QID) | ORAL | Status: DC
  Administered 2020-04-25 – 2020-04-26 (×2): 4 mg via ORAL
  Filled 2020-04-25 (×2): qty 1

## 2020-04-25 MED ORDER — THROMBIN 5000 UNITS EX SOLR: OROMUCOSAL | Status: DC | PRN

## 2020-04-25 MED ORDER — ACETAMINOPHEN 325 MG PO TABS
650.0000 mg | ORAL_TABLET | ORAL | Status: DC | PRN
  Administered 2020-04-26: 650 mg via ORAL
  Filled 2020-04-25: qty 2

## 2020-04-25 MED ORDER — GLYCOPYRROLATE PF 0.2 MG/ML IJ SOSY
PREFILLED_SYRINGE | INTRAMUSCULAR | Status: DC | PRN
  Administered 2020-04-25 (×2): .1 mg via INTRAVENOUS

## 2020-04-25 MED ORDER — GABAPENTIN 300 MG PO CAPS
300.0000 mg | ORAL_CAPSULE | ORAL | Status: AC
  Administered 2020-04-25: 12:00:00 300 mg via ORAL
  Filled 2020-04-25: qty 1

## 2020-04-25 MED ORDER — POTASSIUM CHLORIDE IN NACL 20-0.9 MEQ/L-% IV SOLN
INTRAVENOUS | Status: DC
  Filled 2020-04-25: qty 1000

## 2020-04-25 MED ORDER — PROPOFOL 10 MG/ML IV BOLUS
INTRAVENOUS | Status: DC | PRN
Start: 2020-04-25 — End: 2020-04-25
  Administered 2020-04-25: 170 mg via INTRAVENOUS

## 2020-04-25 MED ORDER — ORAL CARE MOUTH RINSE: 15.0000 mL | Freq: Once | OROMUCOSAL | Status: AC

## 2020-04-25 MED ORDER — CHLORHEXIDINE GLUCONATE CLOTH 2 % EX PADS: 6.0000 | MEDICATED_PAD | Freq: Once | CUTANEOUS | Status: DC

## 2020-04-25 MED ORDER — MIDAZOLAM HCL 2 MG/2ML IJ SOLN
INTRAMUSCULAR | Status: AC
  Filled 2020-04-25: qty 2

## 2020-04-25 MED ORDER — DEXAMETHASONE SODIUM PHOSPHATE 10 MG/ML IJ SOLN
INTRAMUSCULAR | Status: AC
  Filled 2020-04-25: qty 1

## 2020-04-25 MED ORDER — ONDANSETRON HCL 4 MG/2ML IJ SOLN
INTRAMUSCULAR | Status: DC | PRN
  Administered 2020-04-25: 4 mg via INTRAVENOUS

## 2020-04-25 MED ORDER — DEXAMETHASONE SODIUM PHOSPHATE 4 MG/ML IJ SOLN
4.0000 mg | Freq: Four times a day (QID) | INTRAMUSCULAR | Status: DC
  Administered 2020-04-25: 18:00:00 4 mg via INTRAVENOUS
  Filled 2020-04-25: qty 1

## 2020-04-25 MED ORDER — PANTOPRAZOLE SODIUM 20 MG PO TBEC
20.0000 mg | DELAYED_RELEASE_TABLET | Freq: Two times a day (BID) | ORAL | Status: DC
  Administered 2020-04-25 – 2020-04-26 (×2): 20 mg via ORAL
  Filled 2020-04-25 (×2): qty 1

## 2020-04-25 MED ORDER — DIAZEPAM 5 MG/ML IJ SOLN: 2.5000 mg | Freq: Once | INTRAMUSCULAR | Status: AC

## 2020-04-25 MED ORDER — GLYCOPYRROLATE PF 0.2 MG/ML IJ SOSY
PREFILLED_SYRINGE | INTRAMUSCULAR | Status: AC
  Filled 2020-04-25: qty 1

## 2020-04-25 MED ORDER — CELECOXIB 200 MG PO CAPS
200.0000 mg | ORAL_CAPSULE | Freq: Two times a day (BID) | ORAL | Status: DC
  Administered 2020-04-25 – 2020-04-26 (×2): 200 mg via ORAL
  Filled 2020-04-25 (×2): qty 1

## 2020-04-25 MED ORDER — THROMBIN 20000 UNITS EX SOLR: CUTANEOUS | Status: DC | PRN

## 2020-04-25 MED ORDER — PRAMIPEXOLE DIHYDROCHLORIDE 1.5 MG PO TABS
3.0000 mg | ORAL_TABLET | Freq: Every day | ORAL | Status: DC
  Administered 2020-04-25: 21:00:00 3 mg via ORAL
  Filled 2020-04-25 (×2): qty 2

## 2020-04-25 MED ORDER — ONDANSETRON HCL 4 MG/2ML IJ SOLN
INTRAMUSCULAR | Status: AC
  Filled 2020-04-25: qty 2

## 2020-04-25 MED ORDER — L-METHYLFOLATE-B6-B12 3-35-2 MG PO TABS
1.0000 | ORAL_TABLET | Freq: Two times a day (BID) | ORAL | Status: DC
  Administered 2020-04-25 – 2020-04-26 (×2): 1 via ORAL
  Filled 2020-04-25 (×3): qty 1

## 2020-04-25 MED ORDER — LACTATED RINGERS IV SOLN: INTRAVENOUS | Status: DC

## 2020-04-25 MED ORDER — SODIUM CHLORIDE 0.9 % IV SOLN: 250.0000 mL | INTRAVENOUS | Status: DC

## 2020-04-25 MED ORDER — OXYCODONE HCL 5 MG PO TABS
ORAL_TABLET | ORAL | Status: AC
  Filled 2020-04-25: qty 1

## 2020-04-25 MED ORDER — ONDANSETRON HCL 4 MG/2ML IJ SOLN: 4.0000 mg | Freq: Four times a day (QID) | INTRAMUSCULAR | Status: DC | PRN

## 2020-04-25 MED ORDER — ACETAMINOPHEN 650 MG RE SUPP: 650.0000 mg | RECTAL | Status: DC | PRN

## 2020-04-25 MED ORDER — MORPHINE SULFATE (PF) 2 MG/ML IV SOLN
2.0000 mg | INTRAVENOUS | Status: DC | PRN
  Administered 2020-04-25: 2 mg via INTRAVENOUS
  Filled 2020-04-25: qty 1

## 2020-04-25 MED ORDER — CHLORHEXIDINE GLUCONATE 0.12 % MT SOLN
OROMUCOSAL | Status: AC
  Administered 2020-04-25: 12:00:00 15 mL via OROMUCOSAL
  Filled 2020-04-25: qty 15

## 2020-04-25 MED ORDER — MODAFINIL 200 MG PO TABS: 200.0000 mg | ORAL_TABLET | Freq: Every day | ORAL | Status: DC

## 2020-04-25 MED ORDER — ROCURONIUM BROMIDE 10 MG/ML (PF) SYRINGE
PREFILLED_SYRINGE | INTRAVENOUS | Status: DC | PRN
  Administered 2020-04-25 (×2): 20 mg via INTRAVENOUS
  Administered 2020-04-25: 70 mg via INTRAVENOUS

## 2020-04-25 MED ORDER — HYDROMORPHONE HCL 1 MG/ML IJ SOLN
0.2500 mg | INTRAMUSCULAR | Status: DC | PRN
  Administered 2020-04-25: 0.5 mg via INTRAVENOUS

## 2020-04-25 MED ORDER — PHENYLEPHRINE 40 MCG/ML (10ML) SYRINGE FOR IV PUSH (FOR BLOOD PRESSURE SUPPORT)
PREFILLED_SYRINGE | INTRAVENOUS | Status: AC
  Filled 2020-04-25: qty 10

## 2020-04-25 MED ORDER — THROMBIN 5000 UNITS EX SOLR
CUTANEOUS | Status: AC
  Filled 2020-04-25: qty 5000

## 2020-04-25 MED ORDER — LIDOCAINE 2% (20 MG/ML) 5 ML SYRINGE
INTRAMUSCULAR | Status: AC
  Filled 2020-04-25: qty 5

## 2020-04-25 MED ORDER — 0.9 % SODIUM CHLORIDE (POUR BTL) OPTIME
TOPICAL | Status: DC | PRN
  Administered 2020-04-25: 14:00:00 1000 mL

## 2020-04-25 SURGICAL SUPPLY — 65 items
ADH SKN CLS APL DERMABOND .7 (GAUZE/BANDAGES/DRESSINGS) ×1
APL SKNCLS STERI-STRIP NONHPOA (GAUZE/BANDAGES/DRESSINGS) ×1
BASKET BONE COLLECTION (BASKET) ×2 IMPLANT
BENZOIN TINCTURE PRP APPL 2/3 (GAUZE/BANDAGES/DRESSINGS) ×2 IMPLANT
BLADE CLIPPER SURG (BLADE) IMPLANT
BONE CANC CHIPS 20CC PCAN1/4 (Bone Implant) ×2 IMPLANT
BUR CARBIDE MATCH 3.0 (BURR) ×2 IMPLANT
CANISTER SUCT 3000ML PPV (MISCELLANEOUS) ×2 IMPLANT
CHIPS CANC BONE 20CC PCAN1/4 (Bone Implant) ×1 IMPLANT
CNTNR URN SCR LID CUP LEK RST (MISCELLANEOUS) ×1 IMPLANT
CONT SPEC 4OZ STRL OR WHT (MISCELLANEOUS) ×2
COVER BACK TABLE 60X90IN (DRAPES) ×2 IMPLANT
COVER WAND RF STERILE (DRAPES) ×2 IMPLANT
DERMABOND ADVANCED (GAUZE/BANDAGES/DRESSINGS) ×1
DERMABOND ADVANCED .7 DNX12 (GAUZE/BANDAGES/DRESSINGS) ×1 IMPLANT
DIFFUSER DRILL AIR PNEUMATIC (MISCELLANEOUS) ×2 IMPLANT
DRAPE C-ARM 42X72 X-RAY (DRAPES) ×2 IMPLANT
DRAPE C-ARMOR (DRAPES) ×2 IMPLANT
DRAPE LAPAROTOMY 100X72X124 (DRAPES) ×2 IMPLANT
DRAPE SURG 17X23 STRL (DRAPES) ×2 IMPLANT
DRSG OPSITE POSTOP 4X6 (GAUZE/BANDAGES/DRESSINGS) ×1 IMPLANT
DURAPREP 26ML APPLICATOR (WOUND CARE) ×2 IMPLANT
ELECT REM PT RETURN 9FT ADLT (ELECTROSURGICAL) ×2
ELECTRODE REM PT RTRN 9FT ADLT (ELECTROSURGICAL) ×1 IMPLANT
EVACUATOR 1/8 PVC DRAIN (DRAIN) ×2 IMPLANT
GAUZE 4X4 16PLY RFD (DISPOSABLE) IMPLANT
GLOVE BIO SURGEON STRL SZ7 (GLOVE) IMPLANT
GLOVE BIO SURGEON STRL SZ8 (GLOVE) ×4 IMPLANT
GLOVE BIOGEL PI IND STRL 7.0 (GLOVE) IMPLANT
GLOVE BIOGEL PI INDICATOR 7.0 (GLOVE)
GOWN STRL REUS W/ TWL LRG LVL3 (GOWN DISPOSABLE) IMPLANT
GOWN STRL REUS W/ TWL XL LVL3 (GOWN DISPOSABLE) ×2 IMPLANT
GOWN STRL REUS W/TWL 2XL LVL3 (GOWN DISPOSABLE) IMPLANT
GOWN STRL REUS W/TWL LRG LVL3 (GOWN DISPOSABLE)
GOWN STRL REUS W/TWL XL LVL3 (GOWN DISPOSABLE) ×4
GRAFT BNE CANC CHIPS 1-8 20CC (Bone Implant) IMPLANT
GRAFT BONE PROTEIOS LRG 5CC (Orthopedic Implant) ×1 IMPLANT
HEMOSTAT POWDER KIT SURGIFOAM (HEMOSTASIS) IMPLANT
KIT BASIN OR (CUSTOM PROCEDURE TRAY) ×2 IMPLANT
KIT TURNOVER KIT B (KITS) ×2 IMPLANT
MILL MEDIUM DISP (BLADE) IMPLANT
NDL HYPO 25X1 1.5 SAFETY (NEEDLE) ×1 IMPLANT
NEEDLE HYPO 25X1 1.5 SAFETY (NEEDLE) ×2 IMPLANT
NS IRRIG 1000ML POUR BTL (IV SOLUTION) ×2 IMPLANT
PACK LAMINECTOMY NEURO (CUSTOM PROCEDURE TRAY) ×2 IMPLANT
PAD ARMBOARD 7.5X6 YLW CONV (MISCELLANEOUS) ×6 IMPLANT
ROD LORD LIPPED TI 5.5X35 (Rod) ×1 IMPLANT
ROD LORD LIPPED TI 5.5X40 (Rod) ×1 IMPLANT
SCREW CORT SHANK MOD 6.5X40 (Screw) ×8 IMPLANT
SCREW POLYAXIAL TULIP (Screw) ×8 IMPLANT
SET SCREW (Screw) ×8 IMPLANT
SET SCREW SPNE (Screw) IMPLANT
SPACER PEEK PS 25X9MM 9MM 5DEG (Spacer) ×2 IMPLANT
SPONGE LAP 4X18 RFD (DISPOSABLE) IMPLANT
SPONGE SURGIFOAM ABS GEL 100 (HEMOSTASIS) ×2 IMPLANT
STRIP CLOSURE SKIN 1/2X4 (GAUZE/BANDAGES/DRESSINGS) ×3 IMPLANT
SUT VIC AB 0 CT1 18XCR BRD8 (SUTURE) ×1 IMPLANT
SUT VIC AB 0 CT1 8-18 (SUTURE) ×2
SUT VIC AB 2-0 CP2 18 (SUTURE) ×2 IMPLANT
SUT VIC AB 3-0 SH 8-18 (SUTURE) ×4 IMPLANT
SYR CONTROL 10ML LL (SYRINGE) ×2 IMPLANT
TOWEL GREEN STERILE (TOWEL DISPOSABLE) ×2 IMPLANT
TOWEL GREEN STERILE FF (TOWEL DISPOSABLE) ×2 IMPLANT
TRAY FOLEY MTR SLVR 16FR STAT (SET/KITS/TRAYS/PACK) ×2 IMPLANT
WATER STERILE IRR 1000ML POUR (IV SOLUTION) ×2 IMPLANT

## 2020-04-25 NOTE — Anesthesia Procedure Notes (Signed)
Procedure Name: Intubation Date/Time: 04/25/2020 12:46 PM Performed by: Barrington Ellison, CRNA Pre-anesthesia Checklist: Patient identified, Emergency Drugs available, Suction available and Patient being monitored Patient Re-evaluated:Patient Re-evaluated prior to induction Oxygen Delivery Method: Circle System Utilized Preoxygenation: Pre-oxygenation with 100% oxygen Induction Type: IV induction Ventilation: Mask ventilation without difficulty Laryngoscope Size: Mac and 4 Grade View: Grade I Tube type: Oral Tube size: 7.5 mm Number of attempts: 1 Airway Equipment and Method: Stylet and Oral airway Placement Confirmation: ETT inserted through vocal cords under direct vision,  positive ETCO2 and breath sounds checked- equal and bilateral Secured at: 22 cm Tube secured with: Tape Dental Injury: Teeth and Oropharynx as per pre-operative assessment

## 2020-04-25 NOTE — Progress Notes (Signed)
PHARMACIST - PHYSICIAN ORDER COMMUNICATION  CONCERNING: P&T Medication Policy on Herbal Medications  DESCRIPTION:  This patient's order for:  Astaxanthin  has been noted.  This product(s) is classified as an "herbal" or natural product. Due to a lack of definitive safety studies or FDA approval, nonstandard manufacturing practices, plus the potential risk of unknown drug-drug interactions while on inpatient medications, the Pharmacy and Therapeutics Committee does not permit the use of "herbal" or natural products of this type within [facility].   ACTION TAKEN: The pharmacy department is unable to verify this order at this time and your patient has been informed of this safety policy. Please reevaluate patient's clinical condition at discharge and address if the herbal or natural product(s) should be resumed at that time.   Noah Delaine, RPh Clinical Pharmacist Please check AMION for all [facility] Pharmacy phone numbers After 10:00 PM, call Main Pharmacy [phone] 04/25/2020 5:52 PM

## 2020-04-25 NOTE — Anesthesia Postprocedure Evaluation (Signed)
Anesthesia Post Note  Patient: Ivan Washington  Procedure(s) Performed: LUMBAR FOUR-FIVE POSTERIOR LUMBAR INTERBODY FUSION WITH EXTENSION OF PREVIOUS FUSION (N/A Spine Lumbar)     Patient location during evaluation: PACU Anesthesia Type: General Level of consciousness: awake and alert Pain management: pain level controlled Vital Signs Assessment: post-procedure vital signs reviewed and stable Respiratory status: spontaneous breathing, nonlabored ventilation, respiratory function stable and patient connected to nasal cannula oxygen Cardiovascular status: blood pressure returned to baseline and stable Postop Assessment: no apparent nausea or vomiting Anesthetic complications: no   No complications documented.  Last Vitals:  Vitals:   04/25/20 1641 04/25/20 1657  BP: (!) 158/90 (!) 149/83  Pulse: 63 (!) 59  Resp: 11 16  Temp: 36.4 C   SpO2: 100% 96%    Last Pain:  Vitals:   04/25/20 1641  TempSrc:   PainSc: 10-Worst pain ever                 Nelle Don Sutter Ahlgren

## 2020-04-25 NOTE — H&P (Signed)
Subjective: Patient is a 74 y.o. Washington admitted for PLIF. Onset of symptoms was several months ago, gradually worsening since that time.  The pain is rated severe, and is located at the across the lower back and radiates to legs. The pain is described as aching and occurs all day. The symptoms have been progressive. Symptoms are exacerbated by exercise and standing. MRI or CT showed adjacent level stenosis L4-5   Past Medical History:  Diagnosis Date  . Arthritis   . Cancer [facility])    prostate  . Complication of anesthesia    Hard to wake up  . Depression   . GERD (gastroesophageal reflux disease)   . History of kidney stones   . Hypertension   . Sleep apnea   . Stroke [facility])     Past Surgical History:  Procedure Laterality Date  . BACK SURGERY    . HERNIA REPAIR    . SHOULDER SURGERY    . TONSILLECTOMY    . TRANSURETHRAL RESECTION OF PROSTATE      Prior to Admission medications   Medication Sig Start Date End Date Taking? Authorizing Provider  amLODipine (NORVASC) 10 MG tablet Take 10 mg by mouth daily. 04/17/20  Yes [provider]  Astaxanthin 4 MG CAPS Take 8 mg by mouth 2 (two) times daily.   Yes [provider]  atorvastatin (LIPITOR) 80 MG tablet Take 80 mg by mouth daily. 02/07/20  Yes [provider]  Carboxymethylcellulose Sodium (EYE DROPS OP) Place 1 drop into both eyes daily as needed (Dry eyes). Rain eye Drops   Yes [provider]  l-methylfolate-B6-B12 (METANX) 3-35-2 MG TABS tablet Take 1 tablet by mouth 2 (two) times daily.   Yes [provider]  lansoprazole (PREVACID) 15 MG capsule Take 15 mg by mouth 2 (two) times daily.   Yes [provider]  modafinil (PROVIGIL) 200 MG tablet Take 200 mg by mouth daily at 12 noon. 11/10/17  Yes [provider]  pramipexole (MIRAPEX) 1.5 MG tablet Take 3 mg by mouth at bedtime. 04/02/20  Yes [provider]  TRINTELLIX 20 MG TABS tablet Take 20 mg by mouth  daily. 04/12/20  Yes [provider]  naproxen sodium (ALEVE) 220 MG tablet Take 880 mg by mouth daily as needed (pAIN).    [provider]  rivaroxaban (XARELTO) 20 MG TABS tablet Take 20 mg by mouth daily.    [provider]   Allergies  Allergen Reactions  . Other Other (See Comments)    Pollen    Social History   Tobacco Use  . Smoking status: Never Smoker  . Smokeless tobacco: Never Used  Substance Use Topics  . Alcohol use: Yes    Comment: 1 x week    History reviewed. No pertinent family history.   Review of Systems  Positive ROS: neg  All other systems have been reviewed and were otherwise negative with the exception of those mentioned in the HPI and as above.  Objective: Vital signs in last 24 hours: Temp:  [98.4 F (36.9 C)] 98.4 F (36.9 C) (12/29 1021) Pulse Rate:  [71] 71 (12/29 1021) Resp:  [17] 17 (12/29 1021) BP: (169)/(95) 169/95 (12/29 1021) SpO2:  [96 %] 96 % (12/29 1021) Weight:  [98.4 kg] 98.4 kg (12/29 1021)  General Appearance: Alert, cooperative, no distress, appears stated age Head: Normocephalic, without obvious abnormality, atraumatic Eyes: PERRL, conjunctiva/corneas clear, EOM's intact    Neck: Supple, symmetrical, trachea midline Back: Symmetric, no  curvature, ROM normal, no CVA tenderness Lungs:  respirations unlabored Heart: Regular rate and rhythm Abdomen: Soft, non-tender Extremities: Extremities normal, atraumatic, no cyanosis or edema Pulses: 2+ and symmetric all extremities Skin: Skin color, texture, turgor normal, no rashes or lesions  NEUROLOGIC:   Mental status: Alert and oriented x4,  no aphasia, good attention span, fund of knowledge, and memory Motor Exam - grossly normal Sensory Exam - grossly normal Reflexes: 1+ Coordination - grossly normal Gait - grossly normal Balance - grossly normal Cranial Nerves: I: smell Not tested  II: visual acuity  OS: nl    OD: nl  II: visual fields Full to  confrontation  II: pupils Equal, round, reactive to light  III,VII: ptosis None  III,IV,VI: extraocular muscles  Full ROM  V: mastication Normal  V: facial light touch sensation  Normal  V,VII: corneal reflex  Present  VII: facial muscle function - upper  Normal  VII: facial muscle function - lower Normal  VIII: hearing Not tested  IX: soft palate elevation  Normal  IX,X: gag reflex Present  XI: trapezius strength  5/5  XI: sternocleidomastoid strength 5/5  XI: neck flexion strength  5/5  XII: tongue strength  Normal    Data Review Lab Results  Component Value Date   WBC 4.6 06/30/2008   HGB 16.3 06/30/2008   HCT 46.7 06/30/2008   MCV 93.8 06/30/2008   PLT 226 06/30/2008   Lab Results  Component Value Date   NA 137 06/30/2008   K 4.5 06/30/2008   CL 101 06/30/2008   CO2 30 06/30/2008   BUN 22 06/30/2008   CREATININE 1.07 06/30/2008   GLUCOSE 134 (H) 06/30/2008   Lab Results  Component Value Date   INR 1.0 06/30/2008    Assessment/Plan:  Estimated body mass index is 31.14 kg/m as calculated from the following:   Height as of this encounter: 5\' 10"  (1.778 m).   Weight as of this encounter: 98.4 kg. Patient admitted for PLIF L4-5. Patient has failed a reasonable attempt at conservative therapy.  I explained the condition and procedure to the patient and answered any questions.  Patient wishes to proceed with procedure as planned. Understands risks/ benefits and typical outcomes of procedure.   04/25/2020 11:46 AM

## 2020-04-25 NOTE — Anesthesia Preprocedure Evaluation (Addendum)
Anesthesia Evaluation  Patient identified by MRN, date of birth, ID band Patient awake    Reviewed: NPO status , Patient's Chart, lab work & pertinent test results  History of Anesthesia Complications (+) PROLONGED EMERGENCE  Airway Mallampati: II  TM Distance: >3 FB Neck ROM: Full    Dental  (+) Teeth Intact   Pulmonary sleep apnea and Continuous Positive Airway Pressure Ventilation ,    Pulmonary exam normal        Cardiovascular hypertension, Pt. on medications  Rhythm:Regular Rate:Normal     Neuro/Psych Depression CVA    GI/Hepatic Neg liver ROS, GERD  Medicated and Controlled,  Endo/Other  negative endocrine ROS  Renal/GU Stone history   Prostate Ca    Musculoskeletal  (+) Arthritis , Osteoarthritis,  L4/L5 hardware removal for stenosis instability   Abdominal (+)  Abdomen: soft. Bowel sounds: normal.  Peds  Hematology negative hematology ROS (+)   Anesthesia Other Findings   Reproductive/Obstetrics                             Anesthesia Physical Anesthesia Plan  ASA: III  Anesthesia Plan: General   Post-op Pain Management:    Induction: Intravenous  PONV Risk Score and Plan: 2 and Ondansetron, Dexamethasone and Treatment may vary due to age or medical condition  Airway Management Planned: Mask and Oral ETT  Additional Equipment: None  Intra-op Plan:   Post-operative Plan: Extubation in OR  Informed Consent: I have reviewed the patients History and Physical, chart, labs and discussed the procedure including the risks, benefits and alternatives for the proposed anesthesia with the patient or authorized representative who has indicated his/her understanding and acceptance.     Dental advisory given  Plan Discussed with: CRNA  Anesthesia Plan Comments: (Covid-19 Nucleic Acid Test Results Lab Results      Component                Value               Date                       SARSCOV2NAA              NEGATIVE            04/23/2020           Lab Results      Component                Value               Date                      WBC                      4.4                 04/25/2020                HGB                      13.6                04/25/2020                HCT  40.5                04/25/2020                MCV                      94.0                04/25/2020                PLT                      170                 04/25/2020           )       Anesthesia Quick Evaluation

## 2020-04-25 NOTE — Transfer of Care (Signed)
Immediate Anesthesia Transfer of Care Note  Patient: Ivan Washington  Procedure(s) Performed: LUMBAR FOUR-FIVE POSTERIOR LUMBAR INTERBODY FUSION WITH EXTENSION OF PREVIOUS FUSION (N/A Spine Lumbar)  Patient Location: PACU  Anesthesia Type:General  Level of Consciousness: drowsy and patient cooperative  Airway & Oxygen Therapy: Patient Spontanous Breathing and Patient connected to face mask oxygen  Post-op Assessment: Report given to RN and Patient moving all extremities X 4  Post vital signs: Reviewed and stable  Last Vitals:  Vitals Value Taken Time  BP 112/71 04/25/20 1608  Temp    Pulse 63 04/25/20 1608  Resp 16 04/25/20 1608  SpO2 99 % 04/25/20 1608    Last Pain:  Vitals:   04/25/20 1129  TempSrc:   PainSc: 0-No pain      Patients Stated Pain Goal: 3 (04/25/20 1129)  Complications: No complications documented.

## 2020-04-25 NOTE — Op Note (Signed)
04/25/2020  4:02 PM  PATIENT:  Ivan Washington  74 y.o. 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 Decompressive lumbar laminectomy hemifacetectomy foraminotomies L4-5 requiring more work than would be required for a simple exposure of the disk for PLIF in order to adequately decompress the neural elements and address the spinal stenosis 2. Posterior lumbar interbody fusion L4-5 using peek interbody cages packed with morcellized allograft and autograft  3. Posterior fixation L4-5 using Alphatec cortical pedicle screws.  4. Intertransverse arthrodesis L4-5 right using morcellized autograft and allograft. 5.  Removal of nonsegmental fixation L5-S1 with exploration of fusion L5-S1  SURGEON:  Sherley Bounds, MD  ASSISTANTS: Glenford Peers, FNP  ANESTHESIA:  General  EBL: 275 ml  Total I/O In: 1550 [I.V.:1300; IV Piggyback:250] Out: 925 [Urine:650; Blood:275]  BLOOD ADMINISTERED:none  DRAINS: none   INDICATION FOR PROCEDURE: This patient presented with back and leg pain left greater than right. Imaging revealed previous L5-S1 fusion which appeared to be solid with adjacent level stenosis and spondylolisthesis at L4-5 above this.. The patient tried a reasonable attempt at conservative medical measures without relief. I recommended decompression and instrumented fusion to address the stenosis as well as the segmental  instability.  Patient understood the risks, benefits, and alternatives and potential outcomes and wished to proceed.  PROCEDURE DETAILS:  The patient was brought to the operating room. After induction of generalized endotracheal anesthesia the patient was rolled into the prone position on chest rolls and all pressure points were padded. The patient's lumbar region was cleaned and then prepped with DuraPrep and draped in the usual sterile fashion. Anesthesia  was injected and then a dorsal midline incision was made and carried down to the lumbosacral fascia. The fascia was opened and the paraspinous musculature was taken down in a subperiosteal fashion to expose L4-5 as well as the previously placed instrumentation at L5-S1. A self-retaining retractor was placed.  The locking caps were removed followed by the rods.  Then removed the L5 and S1 pedicle screws.  I dissected into the intertransverse space on the right and explored the previous fusion and it looked solid.  Intraoperative fluoroscopy confirmed my level, and I started with placement of the L4 cortical pedicle screws. The pedicle screw entry zones were identified utilizing surface landmarks and  AP and lateral fluoroscopy. I scored the cortex with the high-speed drill and then used the hand drill to drill an upward and outward direction into the pedicle. I then tapped line to line. I then placed a 6.5 x 45 mm cortical pedicle screw into the pedicles of L4 bilaterally.  I then turned my attention to the decompression and complete lumbar laminectomies, hemi- facetectomies, and foraminotomies were performed at L4-5.  My nurse practitioner was directly involved in the decompression and exposure of the neural elements. the patient had significant spinal stenosis and this required more work than would be required for a simple exposure of the disc for posterior lumbar interbody fusion which would only require a limited laminotomy. Much more generous decompression and generous foraminotomy was undertaken in order to adequately decompress the neural elements and address the patient's leg pain. The yellow ligament was removed to expose the underlying dura and nerve roots, and generous foraminotomies were performed to adequately decompress the neural elements. Both the exiting and traversing nerve roots were decompressed on both sides until a coronary dilator passed easily along the  nerve roots. Once the decompression was  complete, I turned my attention to the posterior lower lumbar interbody fusion. The epidural venous vasculature was coagulated and cut sharply. Disc space was incised and the initial discectomy was performed with pituitary rongeurs. The disc space was distracted with sequential distractors to a height of 10 mm. We then used a series of scrapers and shavers to prepare the endplates for fusion. The midline was prepared with Epstein curettes. Once the complete discectomy was finished, we packed an appropriate sized interbody cage with local autograft and morcellized allograft, gently retracted the nerve root, and tapped the cage into position at L4-5.  The midline between the cages was packed with morselized autograft and allograft. We then turned our attention to the placement of the lower pedicle screws. The old pedicle screw holes were palpated and 6.5 x 45 mm cortical pedicle screws were placed into the old holes at L5.  My nurse practitioner assisted in placement of the pedicle screws.  We then decorticated the transverse processes and laid a mixture of morcellized autograft and allograft out over these to perform intertransverse arthrodesis at L4 5 on the right. We then placed lordotic rods into the multiaxial screw heads of the pedicle screws and locked these in position with the locking caps and anti-torque device. We then checked our construct with AP and lateral fluoroscopy. Irrigated with copious amounts of bacitracin-containing saline solution. Inspected the nerve roots once again to assure adequate decompression, lined to the dura with Gelfoam, placed a medium Hemovac drain through a separate stab incision and then we closed the muscle and the fascia with 0 Vicryl. Closed the subcutaneous tissues with 2-0 Vicryl and subcuticular tissues with 3-0 Vicryl. The skin was closed with benzoin and Steri-Strips. Dressing was then applied, the patient was awakened from general anesthesia and transported to the  recovery room in stable condition. At the end of the procedure all sponge, needle and instrument counts were correct.   PLAN OF CARE: admit to inpatient  PATIENT DISPOSITION:  PACU - hemodynamically stable.   Delay start of Pharmacological VTE agent (>24hrs) due to surgical blood loss or risk of bleeding:  yes

## 2020-04-26 MED ORDER — METHOCARBAMOL 500 MG PO TABS: 500.0000 mg | ORAL_TABLET | Freq: Four times a day (QID) | ORAL | 1 refills | Status: AC | PRN

## 2020-04-26 MED ORDER — OXYCODONE HCL 5 MG PO TABS: 5.0000 mg | ORAL_TABLET | ORAL | 0 refills | Status: AC | PRN

## 2020-04-26 NOTE — TOC Initial Note (Signed)
Transition of Care [facility]) - Initial/Assessment Note    Patient Details  Name: OKECHUKWU REGNIER MRN: [id number] Date of Birth: [birth year]  Transition of Care [facility]) CM/SW Contact:    Mearl Latin, LCSW Phone Number: 04/26/2020, 9:34 AM  Clinical Narrative:                 CSW received consult for possible home health services at time of discharge. CSW spoke with patient. Patient reported that he would like home health services. Patient reported preference for an agency in network in [place]. CSW sent referral for review with [facility] and was accepted for PT/OT. CSW confirmed PCP and address with patient. Patient states his wife will come pick him up at discharge. No further questions reported at this time.    Expected Discharge Plan: Home w Home Health Services     Patient Goals and CMS Choice Patient states their goals for this hospitalization and ongoing recovery are:: Return home CMS Medicare.gov Compare Post Acute Care list provided to:: Patient Choice offered to / list presented to : Patient  Expected Discharge Plan and Services Expected Discharge Plan: Home w Home Health Services   Discharge Planning Services: CM Consult Post Acute Care Choice: Home Health Living arrangements for the past 2 months: Single Family Home Expected Discharge Date: 04/26/20                         HH Arranged: PT,OT HH Agency: [facility] Date [facility] Agency Contacted: 04/26/20 Time HH Agency Contacted: [id number] Representative spoke with at [facility] Agency: Denyse Amass  Prior Living Arrangements/Services Living arrangements for the past 2 months: Single Family Home   Patient language and need for interpreter reviewed:: Yes Do you feel safe going back to the place where you live?: Yes      Need for Family Participation in Patient Care: No (Comment) Care giver support system in place?: Yes (comment)   Criminal Activity/Legal Involvement Pertinent to Current Situation/Hospitalization: No - Comment as  needed  Activities of Daily Living Home Assistive Devices/Equipment: Cane (specify quad or straight),Eyeglasses,Hearing aid ADL Screening (condition at time of admission) Patient's cognitive ability adequate to safely complete daily activities?: Yes Is the patient deaf or have difficulty hearing?: Yes Does the patient have difficulty seeing, even when wearing glasses/contacts?: No Does the patient have difficulty concentrating, remembering, or making decisions?: No Patient able to express need for assistance with ADLs?: Yes Does the patient have difficulty dressing or bathing?: No Independently performs ADLs?: Yes (appropriate for developmental age) Does the patient have difficulty walking or climbing stairs?: Yes Weakness of Legs: None Weakness of Arms/Hands: None  Permission Sought/Granted Permission sought to share information with : Facility Industrial/product designer granted to share information with : Yes, Verbal Permission Granted     Permission granted to share info w AGENCY: HH        Emotional Assessment Appearance:: Appears stated age Attitude/Demeanor/Rapport: Engaged Affect (typically observed): Accepting,Appropriate,Pleasant Orientation: : Oriented to Self,Oriented to Place,Oriented to  Time,Oriented to Situation Alcohol / Substance Use: Not Applicable Psych Involvement: No (comment)  Admission diagnosis:  S/P lumbar fusion [Z98.1] Patient Active Problem List   Diagnosis Date Noted  . S/P lumbar fusion 04/25/2020   PCP:  [facility], [facility] Pharmacy:   [id number] - [place], [place] - [address] AT [facility] OF [address] & [address] [place] [address] Phone: [phone] Fax: [phone]  Social Determinants of Health (SDOH) Interventions    Readmission Risk Interventions No flowsheet data found.

## 2020-04-26 NOTE — Evaluation (Signed)
Physical Therapy Evaluation Patient Details Name: Ivan Washington MRN: [id number] DOB: 02/22/1946 Today's Date: 04/26/2020   History of Present Illness  Patient is a 74 y.o. male admitted for PLIF. Onset of symptoms was several months ago, gradually worsening since that time.  The pain is rated severe, and is located across the lower back and radiates to legs. 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 Pt underwent L4-5 fusion.  Clinical Impression  Pt presents to PT with deficits in gait, functional mobility, sensation, and activity tolerance. Pt is able to ambulate well with UE support of RW and is able to verbalize back precautions and proper bed mobility technique. Pt ascends steps with UE support of railing at this time and reports he has the support of walls at home as well as assistance from his spouse. Pt is able to complete all mobility at a supervision level at this time and has the necessary level of assistance at home. Pt will benefit from receiving a RW and from HHPT at the time of discharge. Pt has no further acute PT needs.     Follow Up Recommendations Home health PT;Supervision - Intermittent (outpatient PT if unable to have HHPT)    Equipment Recommendations  Rolling walker with 5" wheels    Recommendations for Other Services       Precautions / Restrictions Precautions Precautions: Fall;Back Precaution Booklet Issued: Yes (comment) Required Braces or Orthoses: Spinal Brace Spinal Brace: Lumbar corset Restrictions Weight Bearing Restrictions: No      Mobility  Bed Mobility Overal bed mobility:  (pt declines the need for bed mobility assessment at this time, able to verbalize log roll technique) Bed Mobility: Rolling;Sidelying to Sit Rolling: Supervision Sidelying to sit: Min guard       General bed mobility comments: min guard for safety and cueing for log rolling. able to use elbow and bed rails to push up without assist    Transfers Overall transfer  level: Modified independent Equipment used: Rolling walker (2 wheeled) Transfers: Sit to/from Stand Sit to Stand: Modified independent (Device/Increase time)         General transfer comment: No assist needed, educated on keeping spine straight with transitional movements  Ambulation/Gait Ambulation/Gait assistance: Modified independent (Device/Increase time) Gait Distance (Feet): 200 Feet Assistive device: Rolling walker (2 wheeled) Gait Pattern/deviations: Step-through pattern Gait velocity: functional Gait velocity interpretation: 1.31 - 2.62 ft/sec, indicative of limited community ambulator General Gait Details: pt with steady step through gait, reduced stride length  Stairs Stairs: Yes Stairs assistance: Supervision Stair Management: One rail Right Number of Stairs: 3 General stair comments: pt reports using support of walls when ascending stairs  Wheelchair Mobility    Modified Rankin (Stroke Patients Only)       Balance Overall balance assessment: Needs assistance Sitting-balance support: No upper extremity supported;Feet supported Sitting balance-Leahy Scale: Good     Standing balance support: No upper extremity supported Standing balance-Leahy Scale: Good Standing balance comment: fair static standing without support, use of B UE support for dynamic tasks                             Pertinent Vitals/Pain Pain Assessment: Faces Faces Pain Scale: Hurts little more Pain Location: low back Pain Descriptors / Indicators: Grimacing Pain Intervention(s): Monitored during session    Home Living Family/patient expects to be discharged to:: Private residence Living Arrangements: Spouse/significant other Available Help at Discharge: Family;Available 24 hours/day Type of  Home: House Home Access: Stairs to enter Entrance Stairs-Rails: None Entrance Stairs-Number of Steps: 2 + 1 (through kitchen) Home Layout: One level Home Equipment: Cane - single  point;Shower seat - built in;Grab bars - tub/shower;Hand held shower head;Other (comment)      Prior Function Level of Independence: Independent with assistive device(s)         Comments: pt reports ambulating independently with use of cane or walking stick outdoors     Hand Dominance   Dominant Hand: Right    Extremity/Trunk Assessment   Upper Extremity Assessment Upper Extremity Assessment: Defer to OT evaluation RUE Deficits / Details: 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 Drops things at times LUE Coordination: decreased fine motor    Lower Extremity Assessment Lower Extremity Assessment: Overall WFL for tasks assessed    Cervical / Trunk Assessment Cervical / Trunk Assessment: Other exceptions Cervical / Trunk Exceptions: s/p spinal fusion  Communication   Communication: No difficulties  Cognition Arousal/Alertness: Awake/alert Behavior During Therapy: WFL for tasks assessed/performed Overall Cognitive Status: Within Functional Limits for tasks assessed                                 General Comments: Very pleasant and cooperative      General Comments General comments (skin integrity, edema, etc.): VSS on RA    Exercises     Assessment/Plan    PT Assessment Patent does not need any further PT services  PT Problem List         PT Treatment Interventions      PT Goals (Current goals can be found in the Care Plan section)  Acute Rehab PT Goals Patient Stated Goal: go home, return to independence    Frequency     Barriers to discharge        Co-evaluation               AM-PAC PT "6 Clicks" Mobility  Outcome Measure Help needed turning from your back to your side while in a flat bed without using bedrails?: A Little Help needed moving from lying on your back to sitting on the side of a flat bed without using  bedrails?: A Little Help needed moving to and from a bed to a chair (including a wheelchair)?: None Help needed standing up from a chair using your arms (e.g., wheelchair or bedside chair)?: None Help needed to walk in hospital room?: None Help needed climbing 3-5 steps with a railing? : A Little 6 Click Score: 21    End of Session Equipment Utilized During Treatment: Back brace Activity Tolerance: Patient tolerated treatment well Patient left: in bed;with call bell/phone within reach Nurse Communication: Mobility status      Time: 4158-3094 PT Time Calculation (min) (ACUTE ONLY): 29 min   Charges:   PT Evaluation $PT Eval Low Complexity: 1 Low PT Treatments $Gait Training: 8-22 mins        Arlyss Gandy, PT, DPT Acute Rehabilitation Pager: [phone]   Arlyss Gandy 04/26/2020, 9:59 AM

## 2020-04-26 NOTE — Discharge Summary (Signed)
Physician Discharge Summary  Patient ID: Ivan Washington MRN: [id number] DOB/AGE: [birth year] 74 y.o.  Admit date: 04/25/2020 Discharge date: 04/26/2020  Admission Diagnoses: adjacent level stenosis/ spondylolisthesis/ radiculopathy    Discharge Diagnoses: same   Discharged Condition: good  Hospital Course: The patient was admitted on 04/25/2020 and taken to the operating room where the patient underwent PLIF L4-5. The patient tolerated the procedure well and was taken to the recovery room and then to the floor in stable condition. The hospital course was routine. There were no complications. The wound remained clean dry and intact. Pt had appropriate back soreness. No complaints of leg pain or new N/T/W. The patient remained afebrile with stable vital signs, and tolerated a regular diet. The patient continued to increase activities, and pain was well controlled with oral pain medications.   Consults: None  Significant Diagnostic Studies:  Results for orders placed or performed during the hospital encounter of 04/25/20  Surgical pcr screen   Specimen: Nasal Mucosa; Nasal Swab  Result Value Ref Range   MRSA, PCR NEGATIVE NEGATIVE   Staphylococcus aureus POSITIVE (A) NEGATIVE  Basic metabolic panel  Result Value Ref Range   Sodium 137 135 - 145 mmol/L   Potassium 3.9 3.5 - 5.1 mmol/L   Chloride 102 98 - 111 mmol/L   CO2 27 22 - 32 mmol/L   Glucose, Bld 104 (H) 70 - 99 mg/dL   BUN 19 8 - 23 mg/dL   Creatinine, Ser 0.83 0.61 - 1.24 mg/dL   Calcium 9.3 8.9 - 10.3 mg/dL   GFR, Estimated >60 >60 mL/min   Anion gap 8 5 - 15  CBC WITH DIFFERENTIAL  Result Value Ref Range   WBC 4.4 4.0 - 10.5 K/uL   RBC 4.31 4.22 - 5.81 MIL/uL   Hemoglobin 13.6 13.0 - 17.0 g/dL   HCT 40.5 39.0 - 52.0 %   MCV 94.0 80.0 - 100.0 fL   MCH 31.6 26.0 - 34.0 pg   MCHC 33.6 30.0 - 36.0 g/dL   RDW 14.9 11.5 - 15.5 %   Platelets 170 150 - 400 K/uL   nRBC 0.0 0.0 - 0.2 %   Neutrophils Relative % 57 %    Neutro Abs 2.6 1.7 - 7.7 K/uL   Lymphocytes Relative 30 %   Lymphs Abs 1.3 0.7 - 4.0 K/uL   Monocytes Relative 8 %   Monocytes Absolute 0.4 0.1 - 1.0 K/uL   Eosinophils Relative 3 %   Eosinophils Absolute 0.1 0.0 - 0.5 K/uL   Basophils Relative 1 %   Basophils Absolute 0.0 0.0 - 0.1 K/uL   Immature Granulocytes 1 %   Abs Immature Granulocytes 0.02 0.00 - 0.07 K/uL  Protime-INR  Result Value Ref Range   Prothrombin Time 13.4 11.4 - 15.2 seconds   INR 1.1 0.8 - 1.2  Type and screen [place]  Result Value Ref Range   ABO/RH(D) A POS    Antibody Screen NEG    Sample Expiration      04/28/2020,2359 Performed at [facility], [address] [place], [place] [address]     Chest 2 View  Result Date: 04/25/2020 CLINICAL DATA:  Preoperative assessment for lumbar fusion EXAM: CHEST - 2 VIEW COMPARISON:  June 14, 2008 FINDINGS: Lungs are clear. Heart size and pulmonary vascularity are normal. No adenopathy. Postoperative change noted in the right shoulder and lower cervical regions. There is mild degenerative change in the thoracic spine. IMPRESSION: Lungs clear.  Cardiac silhouette within normal limits. Electronically Signed   By: Lowella Grip III M.D.   On: 04/25/2020 11:12   DG Lumbar Spine 2-3 Views  Result Date: 04/25/2020 CLINICAL DATA:  L4-L5 PLIF EXAM: LUMBAR SPINE - 2-3 VIEW; DG C-ARM 1-60 MIN COMPARISON:  None. FINDINGS: Two intraoperative fluoroscopic images are provided showing posterior inter pedicular fixation hardware at the L4-L5 levels, with associated disc grafts/spacers at the L4-5 and L5-S1 levels. Hardware appears intact and appropriately positioned. Fluoroscopy provided for 39 seconds. IMPRESSION: Intraoperative fluoroscopic images demonstrating posterior interpedicular fixation hardware at the L4-L5 levels, with associated disc grafts/spacers at the L4-5 and L5-S1 levels. No evidence of surgical complicating feature. Electronically  Signed   By: Franki Cabot M.D.   On: 04/25/2020 15:51   DG C-Arm 1-60 Min  Result Date: 04/25/2020 CLINICAL DATA:  L4-L5 PLIF EXAM: LUMBAR SPINE - 2-3 VIEW; DG C-ARM 1-60 MIN COMPARISON:  None. FINDINGS: Two intraoperative fluoroscopic images are provided showing posterior inter pedicular fixation hardware at the L4-L5 levels, with associated disc grafts/spacers at the L4-5 and L5-S1 levels. Hardware appears intact and appropriately positioned. Fluoroscopy provided for 39 seconds. IMPRESSION: Intraoperative fluoroscopic images demonstrating posterior interpedicular fixation hardware at the L4-L5 levels, with associated disc grafts/spacers at the L4-5 and L5-S1 levels. No evidence of surgical complicating feature. Electronically Signed   By: Franki Cabot M.D.   On: 04/25/2020 15:51    Antibiotics:  Anti-infectives (From admission, onward)   Start     Dose/Rate Route Frequency Ordered Stop   04/25/20 2000  ceFAZolin (ANCEF) IVPB 2g/100 mL premix        2 g 200 mL/hr over 30 Minutes Intravenous Every 8 hours 04/25/20 1728 04/26/20 0341   04/25/20 1045  ceFAZolin (ANCEF) IVPB 2g/100 mL premix        2 g 200 mL/hr over 30 Minutes Intravenous On call to O.R. 04/25/20 1038 04/25/20 1247      Discharge Exam: Blood pressure 112/69, pulse 71, temperature 98.4 F (36.9 C), temperature source Oral, resp. rate 20, height 5\' 10"  (1.778 m), weight 98.4 kg, SpO2 94 %. Neurologic: Grossly normal Dressing dry  Discharge Medications:   Allergies as of 04/26/2020      Reactions   Other Other (See Comments)   Pollen      Medication List    TAKE these medications   amLODipine 10 MG tablet Commonly known as: NORVASC Take 10 mg by mouth daily.   Astaxanthin 4 MG Caps Take 8 mg by mouth 2 (two) times daily.   atorvastatin 80 MG tablet Commonly known as: LIPITOR Take 80 mg by mouth daily.   EYE DROPS OP Place 1 drop into both eyes daily as needed (Dry eyes). Rain eye Drops    l-methylfolate-B6-B12 3-35-2 MG Tabs tablet Commonly known as: METANX Take 1 tablet by mouth 2 (two) times daily.   lansoprazole 15 MG capsule Commonly known as: PREVACID Take 15 mg by mouth 2 (two) times daily.   methocarbamol 500 MG tablet Commonly known as: ROBAXIN Take 1 tablet (500 mg total) by mouth every 6 (six) hours as needed for muscle spasms.   modafinil 200 MG tablet Commonly known as: PROVIGIL Take 200 mg by mouth daily at 12 noon.   naproxen sodium 220 MG tablet Commonly known as: ALEVE Take 880 mg by mouth daily as needed (pAIN).   oxyCODONE 5 MG immediate release tablet Commonly known as: Oxy IR/ROXICODONE Take 1-2 tablets (5-10 mg total) by mouth every 4 (four)  hours as needed for severe pain.   pramipexole 1.5 MG tablet Commonly known as: MIRAPEX Take 3 mg by mouth at bedtime.   rivaroxaban 20 MG Tabs tablet Commonly known as: XARELTO Take 20 mg by mouth daily.   Trintellix 20 MG Tabs tablet Generic drug: vortioxetine HBr Take 20 mg by mouth daily.            Durable Medical Equipment  (From admission, onward)         Start     Ordered   04/25/20 1729  DME Walker rolling  Once       Question:  Patient needs a walker to treat with the following condition  Answer:  S/P lumbar fusion   04/25/20 1728   04/25/20 1729  DME 3 n 1  Once        04/25/20 1728          Disposition: home   Final Dx: PLIF L4-5  Discharge Instructions     Remove dressing in 72 hours   Complete by: As directed    Call MD for:  difficulty breathing, headache or visual disturbances   Complete by: As directed    Call MD for:  persistant nausea and vomiting   Complete by: As directed    Call MD for:  redness, tenderness, or signs of infection (pain, swelling, redness, odor or green/yellow discharge around incision site)   Complete by: As directed    Call MD for:  severe uncontrolled pain   Complete by: As directed    Call MD for:  temperature >100.4   Complete  by: As directed    Diet - low sodium heart healthy   Complete by: As directed    Increase activity slowly   Complete by: As directed        Follow-up Information    Tia Alert, MD. Schedule an appointment as soon as possible for a visit in 2 week(s).   Specialty: Neurosurgery Contact information: [address] [place] [address] [phone]                Signed: Tia Alert 04/26/2020, 7:43 AM

## 2020-04-26 NOTE — Evaluation (Addendum)
Occupational Therapy Evaluation/Discharge Patient Details Name: Ivan Washington MRN: [id number] DOB: [birth year] Today's Date: 04/26/2020    History of Present Illness Patient is a 74 y.o. male admitted for PLIF. Onset of symptoms was several months ago, gradually worsening since that time.  The pain is rated severe, and is located across the lower back and radiates to legs. 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 Pt underwent L4-5 fusion.   Clinical Impression   PTA, pt lives with spouse and Modified Independent with ADLs, IADLs and mobility using walking stick. Pt presents with deficits in dynamic standing balance and flexibility. Pt with hx of neuropathy and some difficulty with LB ADLs while maintaining spinal precautions. Educated pt on spinal precautions during ADLs and AE that may improve independence at home. Pt overall Supervision for mobility using RW, Setup for UB ADLs and Min A for LB ADLs. Recommend HHOT follow-up to further progress LB ADL independence, educate in use of AE and progress IADLs. OT to sign off at the acute level and defer needs to next venue of care.     Follow Up Recommendations  Home health OT;Supervision - Intermittent (Pt would benefit from OP OT if clinic near pt's home in [place] and if transportation to clinic feasible.)    Equipment Recommendations  Other (comment) (RW; consider AE for LB dressing)    Recommendations for Other Services       Precautions / Restrictions Precautions Precautions: Fall;Back Precaution Booklet Issued: Yes (comment) Required Braces or Orthoses: Spinal Brace Spinal Brace: Lumbar corset Restrictions Weight Bearing Restrictions: No      Mobility Bed Mobility Overal bed mobility: Needs Assistance Bed Mobility: Rolling;Sidelying to Sit Rolling: Supervision Sidelying to sit: Min guard       General bed mobility comments: min guard for safety and cueing for log rolling. able to use elbow and bed rails to  push up without assist    Transfers Overall transfer level: Needs assistance Equipment used: Rolling walker (2 wheeled) Transfers: Sit to/from Stand Sit to Stand: Supervision         General transfer comment: No assist needed, educated on keeping spine straight with transitional movements    Balance Overall balance assessment: Needs assistance Sitting-balance support: No upper extremity supported;Feet supported Sitting balance-Leahy Scale: Fair     Standing balance support: Bilateral upper extremity supported;During functional activity Standing balance-Leahy Scale: Fair Standing balance comment: fair static standing without support, use of B UE support for dynamic tasks                           ADL either performed or assessed with clinical judgement   ADL Overall ADL's : Needs assistance/impaired Eating/Feeding: Independent;Sitting   Grooming: Modified independent;Standing   Upper Body Bathing: Sitting;Set up   Lower Body Bathing: Minimal assistance;Sit to/from stand;Adhering to back precautions   Upper Body Dressing : Set up;Sitting;Standing Upper Body Dressing Details (indicate cue type and reason): Able to manage brace after education Lower Body Dressing: Minimal assistance;Sit to/from stand Lower Body Dressing Details (indicate cue type and reason): Hx of difficulty reaching feet for dressing. With increased time/effort able to cross L LE(R LE more difficult). Educated on use of AE for dressing and pt to look into AE (esp compression stocking aid) Toilet Transfer: Supervision/safety;Ambulation;RW Toilet Transfer Details (indicate cue type and reason): simulated in hallway Toileting- Clothing Manipulation and Hygiene: Supervision/safety;Sit to/from stand       Functional mobility during ADLs: Supervision/safety;Rolling  walker General ADL Comments: Minor limitations in flexibility and ability to reach B feet and maintain spinal precautions     Vision  Baseline Vision/History: Wears glasses Wears Glasses: Reading only Patient Visual Report: No change from baseline Vision Assessment?: No apparent visual deficits     Perception     Praxis      Pertinent Vitals/Pain       Hand Dominance Right   Extremity/Trunk Assessment Upper Extremity Assessment Upper Extremity Assessment: RUE deficits/detail;LUE deficits/detail RUE Deficits / Details: 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 Drops things at times LUE Coordination: decreased fine motor   Lower Extremity Assessment Lower Extremity Assessment: Defer to PT evaluation   Cervical / Trunk Assessment Cervical / Trunk Assessment: Normal   Communication Communication Communication: No difficulties   Cognition Arousal/Alertness: Awake/alert Behavior During Therapy: WFL for tasks assessed/performed Overall Cognitive Status: Within Functional Limits for tasks assessed                                 General Comments: Very pleasant and cooperative   General Comments       Exercises     Shoulder Instructions      Home Living Family/patient expects to be discharged to:: Private residence Living Arrangements: Spouse/significant other Available Help at Discharge: Family;Available 24 hours/day Type of Home: House Home Access: Stairs to enter [employer] of Steps: 2 + 1 (through kitchen) Entrance Stairs-Rails: None Home Layout: One level     Bathroom Shower/Tub: Occupational psychologist: Handicapped height     Home Equipment: Cane - single point;Shower seat - built in;Grab bars - tub/shower;Hand held shower head;Other (comment) (walking sticks)          Prior Functioning/Environment Level of Independence: Independent with assistive device(s)        Comments: Uses walking stick for mobility, hx of balance issues and  neuropathy of B feet. Pt Able to complete ADLs, IADLs. Retired        OT Problem List: Decreased strength;Decreased activity tolerance;Impaired balance (sitting and/or standing)      OT Treatment/Interventions:      OT Goals(Current goals can be found in the care plan section) Acute Rehab OT Goals Patient Stated Goal: go home, return to independence OT Goal Formulation: All assessment and education complete, DC therapy  OT Frequency:     Barriers to D/C:            Co-evaluation              AM-PAC OT "6 Clicks" Daily Activity     Outcome Measure Help from another person eating meals?: None Help from another person taking care of personal grooming?: A Little Help from another person toileting, which includes using toliet, bedpan, or urinal?: A Little Help from another person bathing (including washing, rinsing, drying)?: A Little Help from another person to put on and taking off regular upper body clothing?: A Little Help from another person to put on and taking off regular lower body clothing?: A Little 6 Click Score: 19   End of Session Equipment Utilized During Treatment: Back brace;Rolling walker Nurse Communication: Mobility status  Activity Tolerance: Patient tolerated treatment well Patient left: in bed;with call bell/phone within reach;Other (comment) (sitting EOB with PT)  OT Visit Diagnosis: Other abnormalities of gait and mobility (R26.89);Muscle weakness (  generalized) (M62.81)                TimeSW:[id number] OT Time Calculation (min): 42 min Charges:  OT General Charges $OT Visit: 1 Visit OT Evaluation $OT Eval Low Complexity: 1 Low OT Treatments $Self Care/Home Management : 8-22 mins $Therapeutic Activity: 8-22 mins  Layla Maw, OTR/L  Layla Maw 04/26/2020, 9:31 AM

## 2020-04-26 NOTE — Progress Notes (Signed)
Patient alert and oriented, mae's well, voiding adequate amount of urine, swallowing without difficulty, no c/o pain at time of discharge. Patient discharged home with family. Script and discharged instructions given to patient. Patient and family stated understanding of instructions given. Patient has an appointment with Dr. Jones °

## 2020-04-30 ENCOUNTER — Other Ambulatory Visit (HOSPITAL_COMMUNITY): Payer: Medicare Other

## 2020-05-21 ENCOUNTER — Other Ambulatory Visit: Payer: Self-pay

## 2020-05-21 ENCOUNTER — Ambulatory Visit (HOSPITAL_COMMUNITY)
Admission: RE | Admit: 2020-05-21 | Discharge: 2020-05-21 | Disposition: A | Discharge status: Home / Self Care | Payer: Medicare Other | Source: Ambulatory Visit | Attending: Neurological Surgery | Admitting: Neurological Surgery

## 2020-05-21 ENCOUNTER — Other Ambulatory Visit (HOSPITAL_COMMUNITY): Payer: Self-pay | Admitting: Neurological Surgery

## 2020-05-21 DIAGNOSIS — M79661 Pain in right lower leg: Secondary | ICD-10-CM

## 2020-05-21 DIAGNOSIS — M79662 Pain in left lower leg: Secondary | ICD-10-CM | POA: Insufficient documentation
